# Patient Record
Sex: Female | Born: 1975 | Race: Black or African American | Hispanic: No | Marital: Married | Smoking: Never smoker
Health system: Southern US, Community
[De-identification: ages and names within clinical notes are randomized; demographics above are authoritative.]

## PROBLEM LIST (undated history)

## (undated) DIAGNOSIS — G43909 Migraine, unspecified, not intractable, without status migrainosus: Secondary | ICD-10-CM

## (undated) DIAGNOSIS — K589 Irritable bowel syndrome without diarrhea: Secondary | ICD-10-CM

## (undated) DIAGNOSIS — F419 Anxiety disorder, unspecified: Secondary | ICD-10-CM

## (undated) HISTORY — PX: TONSILLECTOMY: SUR1361

## (undated) HISTORY — PX: APPENDECTOMY: SHX54

## (undated) HISTORY — PX: CHOLECYSTECTOMY: SHX55

## (undated) HISTORY — PX: ANKLE SURGERY: SHX546

## (undated) HISTORY — PX: OOPHORECTOMY: SHX86

## (undated) HISTORY — PX: ABDOMINAL HYSTERECTOMY: SHX81

---

## 2017-02-09 ENCOUNTER — Encounter: Payer: Self-pay | Admitting: *Deleted

## 2017-02-09 ENCOUNTER — Ambulatory Visit
Admission: EM | Admit: 2017-02-09 | Discharge: 2017-02-09 | Disposition: A | Discharge status: Home / Self Care | Payer: BC Managed Care – PPO | Attending: Family Medicine | Admitting: Family Medicine

## 2017-02-09 ENCOUNTER — Ambulatory Visit (INDEPENDENT_AMBULATORY_CARE_PROVIDER_SITE_OTHER): Payer: BC Managed Care – PPO

## 2017-02-09 DIAGNOSIS — M940 Chondrocostal junction syndrome [Tietze]: Secondary | ICD-10-CM | POA: Diagnosis not present

## 2017-02-09 DIAGNOSIS — Z79899 Other long term (current) drug therapy: Secondary | ICD-10-CM | POA: Diagnosis not present

## 2017-02-09 DIAGNOSIS — B349 Viral infection, unspecified: Secondary | ICD-10-CM

## 2017-02-09 DIAGNOSIS — M791 Myalgia, unspecified site: Secondary | ICD-10-CM | POA: Diagnosis not present

## 2017-02-09 DIAGNOSIS — R590 Localized enlarged lymph nodes: Secondary | ICD-10-CM | POA: Diagnosis present

## 2017-02-09 DIAGNOSIS — Z9049 Acquired absence of other specified parts of digestive tract: Secondary | ICD-10-CM | POA: Diagnosis not present

## 2017-02-09 DIAGNOSIS — R0789 Other chest pain: Secondary | ICD-10-CM | POA: Diagnosis not present

## 2017-02-09 DIAGNOSIS — R0981 Nasal congestion: Secondary | ICD-10-CM | POA: Diagnosis present

## 2017-02-09 DIAGNOSIS — R6883 Chills (without fever): Secondary | ICD-10-CM

## 2017-02-09 HISTORY — DX: Migraine, unspecified, not intractable, without status migrainosus: G43.909

## 2017-02-09 MED ORDER — OSELTAMIVIR PHOSPHATE 75 MG PO CAPS: 75.0000 mg | ORAL_CAPSULE | Freq: Two times a day (BID) | ORAL | 0 refills | Status: DC

## 2017-02-09 NOTE — ED Triage Notes (Signed)
Runny nose, sneezing, swollen neck glands x1 week.

## 2017-02-09 NOTE — Discharge Instructions (Signed)
Take medication as prescribed. Rest. Drink plenty of fluids.  ° °Follow up with your primary care physician this week as needed. Return to Urgent care for new or worsening concerns.  ° °

## 2017-02-09 NOTE — ED Provider Notes (Signed)
[facility] ____________________________________________  Time seen: Approximately 9:00 AM  I have reviewed the triage vital signs and the nursing notes.   HISTORY  Chief Complaint Nasal Congestion and Lymphadenopathy   HPI Mindy Logan is a 42 y.o. female presenting with spouse at bedside for evaluation of 3 days of some runny nose, chills, body aches, with onset of right-sided gland discomfort and right-sided chest discomfort yesterday.  Patient denies sore throat or difficulty with swallowing.  States right neck gland felt like it was swollen.  States that she was having right chest discomfort that was present with deep breaths, palpation and movement.  States when sitting completely still and breathing normally, no chest pain.  Denies shortness of breath.  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  Reports continues to eat and drink well.  States has been taking some over-the-counter Aleve which helps as well as was taken elderberry.  Denies other aggravating or alleviating factors. Denies fall or trauma.  Denies hemoptysis, extremity swelling, syncope, near syncope, vision changes, paresthesias, pain radiation, recent immobilization, history of PE or DVT, history of cancer.  Reports otherwise feels well. Denies recent sickness. Denies recent antibiotic use. Not a smoker.  Darnelle MaffucciYang, Helen, MD: PCP No LMP recorded. Patient has had a hysterectomy.   Past Medical History:  Diagnosis Date  . Migraine     There are no active problems to display for this patient.   Past Surgical History:  Procedure Laterality Date  . ABDOMINAL HYSTERECTOMY    . ANKLE SURGERY Left   . APPENDECTOMY    . CHOLECYSTECTOMY    . OOPHORECTOMY    . TONSILLECTOMY       No current facility-administered medications for this encounter.   Current Outpatient Medications:  .  Diclofenac Potassium (CAMBIA PO), Take 50 mg by mouth., Disp: , Rfl:   .  oseltamivir (TAMIFLU) 75 MG capsule, Take 1 capsule (75 mg total) by mouth every 12 (twelve) hours., Disp: 10 capsule, Rfl: 0  Allergies Tetracyclines & related; Iodine; Clindamycin/lincomycin; and Tape  Family History  Problem Relation Age of Onset  . CAD Mother   . CAD Father   . Hyperlipidemia Father   Denies family history of clotting disorders, PE or DVT.  Social History Social History   Tobacco Use  . Smoking status: Never Smoker  . Smokeless tobacco: Never Used  Substance Use Topics  . Alcohol use: Yes  . Drug use: No    Review of Systems Constitutional: No fever/chills ENT: No sore throat. As above.  Cardiovascular: Positive chest pain. Respiratory: Denies shortness of breath. Gastrointestinal: No abdominal pain.  No nausea, no vomiting.  No diarrhea.  Genitourinary: Negative for dysuria. Musculoskeletal: Negative for back pain. Skin: Negative for rash. Neurological: Negative for focal weakness or numbness.  ____________________________________________   PHYSICAL EXAM:  VITAL SIGNS: ED Triage Vitals  Enc Vitals Group     BP 02/09/17 0840 134/90     Pulse Rate 02/09/17 0840 79     Resp 02/09/17 0840 16     Temp 02/09/17 0840 99 F (37.2 C)     Temp Source 02/09/17 0840 Oral     SpO2 02/09/17 0840 100 %     Weight 02/09/17 0842 205 lb (93 kg)     Height 02/09/17 0842 5\' 9"  (1.753 m)     Head Circumference --      Peak Flow --      Pain Score --  Pain Loc --      Pain Edu? --      Excl. in GC? --    Constitutional: Alert and oriented. Well appearing and in no acute distress. Eyes: Conjunctivae are normal. Head: Atraumatic. No sinus tenderness to palpation. No swelling. No erythema.  Ears: no erythema, normal TMs bilaterally. No mastoid tenderness bilaterally.  Nose:Nasal congestion   Mouth/Throat: Mucous membranes are moist. No pharyngeal erythema. No tonsillar swelling or exudate.  Neck: No stridor.  No cervical spine tenderness to  palpation. Hematological/Lymphatic/Immunilogical: No cervical lymphadenopathy.  No thyromegaly palpated. Cardiovascular: Normal rate, regular rhythm. Grossly normal heart sounds.  Good peripheral circulation. Respiratory: Normal respiratory effort.  No retractions. No wheezes, rales or rhonchi. Good air movement.  Gastrointestinal: Soft and nontender. No CVA tenderness. Musculoskeletal: Ambulatory with steady gait. No cervical, thoracic or lumbar tenderness to palpation.  Bilateral lower extremities nontender no edema noted.  Left chest nontender.  Right anterior mid chest mid clavicular line moderate tenderness to palpation, no rash, no erythema. Neurologic:  Normal speech and language. No gait instability. No focal neurological deficits noted.  Skin:  Skin appears warm, dry and intact. No rash noted. Psychiatric: Mood and affect are normal. Speech and behavior are normal.  PERC score for PE negative ___________________________________________   LABS (all labs ordered are listed, but only abnormal results are displayed)  Labs Reviewed - No data to display ____________________________________________  EKG  ED ECG REPORT I, Renford Dills, the attending provider, personally viewed and interpreted this ECG.   Date: 02/09/2017  EKG Time: 0921  Rate: 63  Rhythm: normal sinus rhythm  Axis: normal  Intervals:none  ST&T Change: No ST or T wave changes noted.   ____________________________________________  RADIOLOGY  Dg Chest 2 View  Result Date: 02/09/2017 CLINICAL DATA:  42 year old female with history of right-sided chest pain since yesterday evening. History of pneumonia. Prior history of pleurisy. EXAM: CHEST  2 VIEW COMPARISON:  No priors. FINDINGS: Lung volumes are normal. No consolidative airspace disease. No pleural effusions. No pneumothorax. No pulmonary nodule or mass noted. Pulmonary vasculature and the cardiomediastinal silhouette are within normal limits. IMPRESSION: No  radiographic evidence of acute cardiopulmonary disease. Electronically Signed   By: Trudie Reed M.D.   On: 02/09/2017 09:36   ____________________________________________   PROCEDURES Procedures    INITIAL IMPRESSION / ASSESSMENT AND PLAN / ED COURSE  Pertinent labs & imaging results that were available during my care of the patient were reviewed by me and considered in my medical decision making (see chart for details).  Well-appearing patient.  No acute distress.  Suspect viral illness, such as influenza-like.  suspect costochondritis.  EKG reviewed, unremarkable.  Chest x-ray results as above, negative.  Was able to see via care everywhere 12/28/2016 patient seen by cardiology and had a negative exercise stress echocardiogram.  Discussed use of Tamiflu with patient, patient requests Rx.  Will treat with Tamiflu.  Discussed over-the-counter NSAIDs as needed.  Encourage rest, fluids, supportive care.  Discussed strict follow-up and return parameters.Discussed indication, risks and benefits of medications with patient.  Discussed follow up with Primary care physician this week. Discussed follow up and return parameters including no resolution or any worsening concerns. Patient verbalized understanding and agreed to plan.   ____________________________________________   FINAL CLINICAL IMPRESSION(S) / ED DIAGNOSES  Final diagnoses:  Viral illness  Right-sided chest wall pain  Costochondritis     ED Discharge Orders        Ordered    oseltamivir (  TAMIFLU) 75 MG capsule  Every 12 hours     02/09/17 0944       Note: This dictation was prepared with Dragon dictation along with smaller phrase technology. Any transcriptional errors that result from this process are unintentional.         Renford Dills, NP 02/09/17 [phone]

## 2017-02-12 ENCOUNTER — Telehealth: Payer: Self-pay

## 2017-02-12 NOTE — Telephone Encounter (Signed)
Called to follow up with patient since visit here at [facility]. Patient instructed to call back with any questions or concerns. MAH  

## 2017-04-12 ENCOUNTER — Encounter: Payer: Self-pay | Admitting: *Deleted

## 2017-04-12 ENCOUNTER — Ambulatory Visit
Admission: EM | Admit: 2017-04-12 | Discharge: 2017-04-12 | Disposition: A | Discharge status: Home / Self Care | Payer: BC Managed Care – PPO | Attending: Family Medicine | Admitting: Family Medicine

## 2017-04-12 ENCOUNTER — Other Ambulatory Visit: Payer: Self-pay

## 2017-04-12 DIAGNOSIS — J111 Influenza due to unidentified influenza virus with other respiratory manifestations: Secondary | ICD-10-CM

## 2017-04-12 DIAGNOSIS — R69 Illness, unspecified: Secondary | ICD-10-CM | POA: Diagnosis not present

## 2017-04-12 DIAGNOSIS — J209 Acute bronchitis, unspecified: Secondary | ICD-10-CM | POA: Diagnosis not present

## 2017-04-12 DIAGNOSIS — R112 Nausea with vomiting, unspecified: Secondary | ICD-10-CM | POA: Diagnosis not present

## 2017-04-12 HISTORY — DX: Irritable bowel syndrome, unspecified: K58.9

## 2017-04-12 HISTORY — DX: Anxiety disorder, unspecified: F41.9

## 2017-04-12 MED ORDER — AZITHROMYCIN 250 MG PO TABS: 250.0000 mg | ORAL_TABLET | Freq: Every day | ORAL | 0 refills | Status: DC

## 2017-04-12 MED ORDER — OSELTAMIVIR PHOSPHATE 75 MG PO CAPS: 75.0000 mg | ORAL_CAPSULE | Freq: Two times a day (BID) | ORAL | 0 refills | Status: DC

## 2017-04-12 MED ORDER — ONDANSETRON HCL 4 MG PO TABS: 4.0000 mg | ORAL_TABLET | Freq: Four times a day (QID) | ORAL | 0 refills | Status: DC

## 2017-04-12 NOTE — ED Provider Notes (Signed)
[facility]    CSN: [id number] Arrival date & time: 04/12/17  1903     History   Chief Complaint Chief Complaint  Patient presents with  . Nausea  . Fever    HPI Mindy Logan is a 42 y.o. female.    Fever  Temp source:  Subjective Severity:  Mild Onset quality:  Gradual Duration:  1 day Timing:  Constant Progression:  Waxing and waning Chronicity:  New Relieved by:  Nothing Associated symptoms: chills, congestion, cough, ear pain, headaches, somnolence, sore throat and vomiting   Associated symptoms: no diarrhea, no dysuria, no myalgias, no nausea, no rash and no rhinorrhea   Risk factors: no contaminated food, no contaminated water, no hx of cancer, no immunosuppression, no occupational exposure, no recent sickness, no recent travel and no sick contacts   Emesis  Severity:  Mild Timing:  Intermittent Quality:  Coffee grounds Progression:  Partially resolved (just nausea) Associated symptoms: chills, cough, fever, headaches and sore throat   Associated symptoms: no abdominal pain, no arthralgias, no diarrhea and no myalgias   Cough  Cough characteristics:  Productive Sputum characteristics:  Yellow Onset quality:  Sudden Timing:  Intermittent Relieved by:  Cough suppressants Associated symptoms: chills, ear pain, fever, headaches and sore throat   Associated symptoms: no eye discharge, no myalgias, no rash, no rhinorrhea, no shortness of breath and no wheezing     Past Medical History:  Diagnosis Date  . Anxiety   . IBS (irritable bowel syndrome)   . Migraine     There are no active problems to display for this patient.   Past Surgical History:  Procedure Laterality Date  . ABDOMINAL HYSTERECTOMY    . ANKLE SURGERY Left   . APPENDECTOMY    . CHOLECYSTECTOMY    . OOPHORECTOMY    . TONSILLECTOMY      OB History   None      Home Medications    Prior to Admission medications   Medication Sig Start Date End Date Taking? Authorizing  Provider  ALPRAZolam Prudy Feeler(XANAX) 0.25 MG tablet Take by mouth. 10/25/15  Yes [provider]  azithromycin (ZITHROMAX) 250 MG tablet Take 1 tablet (250 mg total) by mouth daily. Take first 2 tablets together, then 1 every day until finished. 04/12/17   Duanne LimerickJones, Dakia Schifano C, MD  B Complex Vitamins (VITAMIN B COMPLEX PO) Take by mouth.    [provider]  CAMBIA 50 MG PACK  01/04/17   [provider]  Cholecalciferol (VITAMIN D3) 10000 units TABS Take by mouth.    [provider]  dicyclomine (BENTYL) 10 MG capsule Take 10 mg by mouth.    [provider]  niacin 50 MG tablet Take 50 mg by mouth.    [provider]  ondansetron (ZOFRAN) 4 MG tablet Take 1 tablet (4 mg total) by mouth every 6 (six) hours. 04/12/17   Duanne LimerickJones, Serene Kopf C, MD  oseltamivir (TAMIFLU) 75 MG capsule Take 1 capsule (75 mg total) by mouth every 12 (twelve) hours. 04/12/17   Duanne LimerickJones, Rockland Kotarski C, MD    Family History Family History  Problem Relation Age of Onset  . CAD Mother   . CAD Father   . Hyperlipidemia Father     Social History Social History   Tobacco Use  . Smoking status: Never Smoker  . Smokeless tobacco: Never Used  Substance Use Topics  . Alcohol use: Yes  . Drug use: No     Allergies   Banana;  Clindamycin; Kiwi extract; Povidone-iodine; Tetracycline; Tetracyclines & related; Iodine; Latex; Shellfish allergy; Wheat extract; Cephalexin; Clindamycin/lincomycin; Soap; and Tape   Review of Systems Review of Systems  Constitutional: Positive for chills and fever. Negative for fatigue and unexpected weight change.  HENT: Positive for congestion, ear pain and sore throat. Negative for ear discharge, rhinorrhea, sinus pressure and sneezing.   Eyes: Negative for photophobia, pain, discharge, redness and itching.  Respiratory: Positive for cough. Negative for shortness of breath, wheezing and stridor.   Gastrointestinal: Positive for vomiting. Negative for abdominal  pain, blood in stool, constipation, diarrhea and nausea.  Endocrine: Negative for cold intolerance, heat intolerance, polydipsia, polyphagia and polyuria.  Genitourinary: Negative for dysuria, flank pain, frequency, hematuria, menstrual problem, pelvic pain, urgency, vaginal bleeding and vaginal discharge.  Musculoskeletal: Negative for arthralgias, back pain and myalgias.  Skin: Negative for rash.  Allergic/Immunologic: Negative for environmental allergies and food allergies.  Neurological: Positive for headaches. Negative for dizziness, weakness, light-headedness and numbness.  Hematological: Negative for adenopathy. Does not bruise/bleed easily.  Psychiatric/Behavioral: Negative for dysphoric mood. The patient is not nervous/anxious.      Physical Exam Triage Vital Signs ED Triage Vitals  Enc Vitals Group     BP 04/12/17 1916 (!) 161/95     Pulse Rate 04/12/17 1916 (!) 101     Resp 04/12/17 1916 16     Temp 04/12/17 1916 (!) 100.4 F (38 C)     Temp Source 04/12/17 1916 Oral     SpO2 04/12/17 1916 99 %     Weight 04/12/17 1919 200 lb (90.7 kg)     Height 04/12/17 1919 5\' 5"  (1.651 m)     Head Circumference --      Peak Flow --      Pain Score 04/12/17 1918 0     Pain Loc --      Pain Edu? --      Excl. in GC? --    No data found.  Updated Vital Signs BP (!) 161/95 (BP Location: Left Arm)   Pulse (!) 101   Temp (!) 100.4 F (38 C) (Oral)   Resp 16   Ht 5\' 5"  (1.651 m)   Wt 200 lb (90.7 kg)   SpO2 99%   BMI 33.28 kg/m   Visual Acuity Right Eye Distance:   Left Eye Distance:   Bilateral Distance:    Right Eye Near:   Left Eye Near:    Bilateral Near:     Physical Exam  Constitutional: She is oriented to person, place, and time. She appears well-developed and well-nourished.  HENT:  Head: Normocephalic.  Right Ear: External ear normal.  Left Ear: External ear normal.  Mouth/Throat: Oropharynx is clear and moist.  Eyes: Pupils are equal, round, and reactive  to light. Conjunctivae and EOM are normal. Lids are everted and swept, no foreign bodies found. Left eye exhibits no hordeolum. No foreign body present in the left eye. Right conjunctiva is not injected. Left conjunctiva is not injected. No scleral icterus.  Neck: Normal range of motion. Neck supple. No JVD present. No tracheal deviation present. No thyromegaly present.  Cardiovascular: Normal rate, regular rhythm, normal heart sounds and intact distal pulses. Exam reveals no gallop and no friction rub.  No murmur heard. Pulmonary/Chest: Effort normal and breath sounds normal. No respiratory distress. She has no wheezes. She has no rales.  Abdominal: Soft. Bowel sounds are normal. She exhibits no mass. There is no hepatosplenomegaly. There is no tenderness. There  is no rebound, no guarding and no CVA tenderness.  Musculoskeletal: Normal range of motion. She exhibits no edema or tenderness.  Lymphadenopathy:    She has no cervical adenopathy.  Neurological: She is alert and oriented to person, place, and time. She has normal strength. She displays normal reflexes. No cranial nerve deficit.  Skin: Skin is warm. No rash noted.  Psychiatric: She has a normal mood and affect. Her mood appears not anxious. She does not exhibit a depressed mood.  Nursing note and vitals reviewed.    UC Treatments / Results  Labs (all labs ordered are listed, but only abnormal results are displayed) Labs Reviewed - No data to display  EKG None Radiology No results found.  Procedures Procedures (including critical care time)  Medications Ordered in UC Medications - No data to display   Initial Impression / Assessment and Plan / UC Course  I have reviewed the triage vital signs and the nursing notes.  Pertinent labs & imaging results that were available during my care of the patient were reviewed by me and considered in my medical decision making (see chart for details).       Final Clinical  Impressions(s) / UC Diagnoses   Final diagnoses:  Influenza-like illness  Acute bronchitis, unspecified organism  Non-intractable vomiting with nausea, unspecified vomiting type    ED Discharge Orders        Ordered    oseltamivir (TAMIFLU) 75 MG capsule  Every 12 hours     04/12/17 2000    azithromycin (ZITHROMAX) 250 MG tablet  Daily     04/12/17 2000    ondansetron (ZOFRAN) 4 MG tablet  Every 6 hours     04/12/17 2000       Controlled Substance Prescriptions  Controlled Substance Registry consulted?    Duanne Limerick, MD 04/12/17 2002

## 2017-04-12 NOTE — ED Triage Notes (Signed)
Patient started having symptoms of fever, nausea, and vomiting yesterday.

## 2017-04-13 ENCOUNTER — Telehealth: Payer: Self-pay

## 2017-04-13 NOTE — Telephone Encounter (Signed)
I called in new prescription for Bactrim DS to [facility] in [place]. I called and spoke to pt and her husband regarding new prescription. They verbalized understanding.

## 2017-09-29 ENCOUNTER — Ambulatory Visit
Admission: EM | Admit: 2017-09-29 | Discharge: 2017-09-29 | Disposition: A | Discharge status: Home / Self Care | Payer: BC Managed Care – PPO | Attending: Physician Assistant | Admitting: Physician Assistant

## 2017-09-29 ENCOUNTER — Other Ambulatory Visit: Payer: Self-pay

## 2017-09-29 ENCOUNTER — Encounter: Payer: Self-pay | Admitting: Gynecology

## 2017-09-29 DIAGNOSIS — N898 Other specified noninflammatory disorders of vagina: Secondary | ICD-10-CM

## 2017-09-29 DIAGNOSIS — N760 Acute vaginitis: Secondary | ICD-10-CM

## 2017-09-29 DIAGNOSIS — R21 Rash and other nonspecific skin eruption: Secondary | ICD-10-CM

## 2017-09-29 DIAGNOSIS — L299 Pruritus, unspecified: Secondary | ICD-10-CM | POA: Diagnosis not present

## 2017-09-29 LAB — WET PREP, GENITAL
CLUE CELLS WET PREP: NONE SEEN
SPERM: NONE SEEN
Trich, Wet Prep: NONE SEEN
YEAST WET PREP: NONE SEEN

## 2017-09-29 MED ORDER — FLUCONAZOLE 150 MG PO TABS: ORAL_TABLET | ORAL | 0 refills | Status: DC

## 2017-09-29 MED ORDER — MICONAZOLE NITRATE 2 % EX CREA: 1.0000 "application " | TOPICAL_CREAM | Freq: Two times a day (BID) | CUTANEOUS | 0 refills | Status: DC

## 2017-09-29 NOTE — ED Triage Notes (Signed)
Patient c/o vaginal rash x 2 weeks ago. Per patient rash spreading to other area of her body.

## 2017-09-29 NOTE — ED Provider Notes (Signed)
[facility]    CSN: [id number] Arrival date & time: 09/29/17  0858     History   Chief Complaint Chief Complaint  Patient presents with  . vaginal rash    HPI Mindy Logan is a 42 y.o. female. Patient presents today for 2 week history of red, itchy rash in the genital region. She states that the rash is spreading. She saw her PCP last week and had a UA which she says ruled out UTI. She has tried OTC Vagisil, Monistat and hydrocortisone cream without relief. Denies associated pain and vaginal discharge. She has no fevers. Denies urgency, frequency and painful urination. She has no other concerns today.  HPI  Past Medical History:  Diagnosis Date  . Anxiety   . IBS (irritable bowel syndrome)   . Migraine     There are no active problems to display for this patient.   Past Surgical History:  Procedure Laterality Date  . ABDOMINAL HYSTERECTOMY    . ANKLE SURGERY Left   . APPENDECTOMY    . CHOLECYSTECTOMY    . OOPHORECTOMY    . TONSILLECTOMY      OB History   None      Home Medications    Prior to Admission medications   Medication Sig Start Date End Date Taking? Authorizing Provider  ALPRAZolam Prudy Feeler) 0.25 MG tablet Take by mouth. 10/25/15  Yes [provider]  B Complex Vitamins (VITAMIN B COMPLEX PO) Take by mouth.   Yes [provider]  CAMBIA 50 MG PACK  01/04/17  Yes [provider]  Cholecalciferol (VITAMIN D3) 10000 units TABS Take by mouth.   Yes [provider]  dicyclomine (BENTYL) 10 MG capsule Take 10 mg by mouth.   Yes [provider]  niacin 50 MG tablet Take 50 mg by mouth.   Yes [provider]  azithromycin (ZITHROMAX) 250 MG tablet Take 1 tablet (250 mg total) by mouth daily. Take first 2 tablets together, then 1 every day until finished. 04/12/17   Duanne Limerick, MD  fluconazole (DIFLUCAN) 150 MG tablet Take 1 tab PO today and repeat every 72 hrs as needed 09/29/17   Eusebio Friendly  B, PA-C  miconazole (MICOTIN) 2 % cream Apply 1 application topically 2 (two) times daily for 7 days. 09/29/17 10/06/17  Eusebio Friendly B, PA-C  ondansetron (ZOFRAN) 4 MG tablet Take 1 tablet (4 mg total) by mouth every 6 (six) hours. 04/12/17   Duanne Limerick, MD  oseltamivir (TAMIFLU) 75 MG capsule Take 1 capsule (75 mg total) by mouth every 12 (twelve) hours. 04/12/17   Duanne Limerick, MD    Family History Family History  Problem Relation Age of Onset  . CAD Mother   . CAD Father   . Hyperlipidemia Father     Social History Social History   Tobacco Use  . Smoking status: Never Smoker  . Smokeless tobacco: Never Used  Substance Use Topics  . Alcohol use: Yes  . Drug use: No     Allergies   Banana; Clindamycin; Kiwi extract; Povidone-iodine; Tetracycline; Tetracyclines & related; Iodine; Latex; Shellfish allergy; Wheat extract; Cephalexin; Clindamycin/lincomycin; Soap; and Tape   Review of Systems Review of Systems  Constitutional: Negative for appetite change and fever.  Gastrointestinal: Negative for abdominal pain, nausea and vomiting.  Genitourinary: Negative for decreased urine volume, difficulty urinating, dysuria, flank pain, genital sores, hematuria, pelvic pain, urgency, vaginal discharge and vaginal pain.  Musculoskeletal: Negative for back pain and  myalgias.  Skin: Positive for rash. Negative for color change and wound.  Neurological: Negative for weakness.  Hematological: Negative for adenopathy.     Physical Exam Triage Vital Signs ED Triage Vitals  Enc Vitals Group     BP 09/29/17 0908 (!) 136/92     Pulse Rate 09/29/17 0908 72     Resp 09/29/17 0908 16     Temp 09/29/17 0908 98.4 F (36.9 C)     Temp Source 09/29/17 0908 Oral     SpO2 09/29/17 0908 99 %     Weight 09/29/17 0908 200 lb (90.7 kg)     Height 09/29/17 0908 5\' 5"  (1.651 m)     Head Circumference --      Peak Flow --      Pain Score 09/29/17 0907 3     Pain Loc --      Pain Edu? --       Excl. in GC? --    No data found.  Updated Vital Signs BP (!) 136/92 (BP Location: Left Arm)   Pulse 72   Temp 98.4 F (36.9 C) (Oral)   Resp 16   Ht 5\' 5"  (1.651 m)   Wt 200 lb (90.7 kg)   SpO2 99%   BMI 33.28 kg/m      Physical Exam  Constitutional: She is oriented to person, place, and time. She appears well-developed and well-nourished. No distress.  HENT:  Head: Normocephalic and atraumatic.  Eyes: Conjunctivae and EOM are normal. No scleral icterus.  Neck: Neck supple.  Cardiovascular: Normal rate, regular rhythm and normal heart sounds.  No murmur heard. Pulmonary/Chest: Effort normal and breath sounds normal. No respiratory distress. She has no wheezes. She has no rales.  Abdominal: Soft. There is no tenderness.  Genitourinary: Vaginal discharge found.  Genitourinary Comments: No obvious rate, very faint hyperpigmentation of mons pubis, labial folds and buttocks. Small amount of thick white discharge  Neurological: She is alert and oriented to person, place, and time.  Skin: Skin is warm and dry. She is not diaphoretic.  Psychiatric: She has a normal mood and affect. Her behavior is normal.  Nursing note and vitals reviewed.    UC Treatments / Results  Labs (all labs ordered are listed, but only abnormal results are displayed) Labs Reviewed  WET PREP, GENITAL - Abnormal; Notable for the following components:      Result Value   WBC, Wet Prep HPF POC FEW (*)    All other components within normal limits    EKG None  Radiology No results found.  Procedures Procedures (including critical care time)  Medications Ordered in UC Medications - No data to display  Initial Impression / Assessment and Plan / UC Course  I have reviewed the triage vital signs and the nursing notes.  Pertinent labs & imaging results that were available during my care of the patient were reviewed by me and considered in my medical decision making (see chart for details).   Wet  prep performed today to assess for yeast cells. Negative yeast, positive WBCs. Exam consistent with fungal vaginitis. Treating with fluconazole and miconazole. F/u with our office or PCP if worsening or no improvement in 1 week.   Final Clinical Impressions(s) / UC Diagnoses   Final diagnoses:  Vaginitis and vulvovaginitis  Rash and nonspecific skin eruption  Itching     Discharge Instructions     Your exam today is consistent with vaginitis most likely caused by a fungal infection.  Keep area clean and dry. Begin oral fluconazole (antifungal medication) and topical miconazole (antifungal medication). These should improve your symptoms over the next week. If symptoms are not improving over the next week or if they worsen f/u with PCP or our clinic for re-examination     ED Prescriptions    Medication Sig Dispense Auth. Provider   fluconazole (DIFLUCAN) 150 MG tablet Take 1 tab PO today and repeat every 72 hrs as needed 3 tablet Eusebio Friendly B, PA-C   miconazole (MICOTIN) 2 % cream Apply 1 application topically 2 (two) times daily for 7 days. 30 g Shirlee Latch, PA-C     Controlled Substance Prescriptions [place] Controlled Substance Registry consulted? Not Applicable   Gareth Morgan 09/29/17 2010

## 2017-09-29 NOTE — Discharge Instructions (Signed)
Your exam today is consistent with vaginitis most likely caused by a fungal infection. Keep area clean and dry. Begin oral fluconazole (antifungal medication) and topical miconazole (antifungal medication). These should improve your symptoms over the next week. If symptoms are not improving over the next week or if they worsen f/u with PCP or our clinic for re-examination

## 2017-10-05 ENCOUNTER — Ambulatory Visit
Admission: EM | Admit: 2017-10-05 | Discharge: 2017-10-05 | Disposition: A | Discharge status: Home / Self Care | Payer: BC Managed Care – PPO | Attending: Family Medicine | Admitting: Family Medicine

## 2017-10-05 ENCOUNTER — Encounter: Payer: Self-pay | Admitting: Gynecology

## 2017-10-05 DIAGNOSIS — R21 Rash and other nonspecific skin eruption: Secondary | ICD-10-CM | POA: Diagnosis not present

## 2017-10-05 MED ORDER — BETAMETHASONE DIPROPIONATE 0.05 % EX OINT: TOPICAL_OINTMENT | Freq: Two times a day (BID) | CUTANEOUS | 0 refills | Status: DC

## 2017-10-05 NOTE — ED Provider Notes (Signed)
[facility]    CSN: [id number] Arrival date & time: 10/05/17  [id number]  History   Chief Complaint Chief Complaint  Patient presents with  . Appointment  . Rash   HPI  42 year old female presents with rash.  Patient reports that she has had an ongoing rash for the past week.  Located at the anterior neck.  Itchy.  Red.  She has been using triamcinolone ointment without resolution.  She is also been taking Benadryl with improvement of the itching but no resolution of the rash.  She states that she has used a new laundry detergent.  No new jewelry or clothing.  No known inciting factor.  No known exacerbating factors.  No other reported symptoms.  No other complaints.   PMH, Surgical Hx, Family Hx, Social History reviewed and updated as below.  Past Medical History:  Diagnosis Date  . Anxiety   . IBS (irritable bowel syndrome)   . Migraine    Past Surgical History:  Procedure Laterality Date  . ABDOMINAL HYSTERECTOMY    . ANKLE SURGERY Left   . APPENDECTOMY    . CHOLECYSTECTOMY    . OOPHORECTOMY    . TONSILLECTOMY     OB History   None    Home Medications    Prior to Admission medications   Medication Sig Start Date End Date Taking? Authorizing Provider  ALPRAZolam Prudy Feeler(XANAX) 0.25 MG tablet Take by mouth. 10/25/15  Yes [provider]  B Complex Vitamins (VITAMIN B COMPLEX PO) Take by mouth.   Yes [provider]  CAMBIA 50 MG PACK  01/04/17  Yes [provider]  Cholecalciferol (VITAMIN D3) 10000 units TABS Take by mouth.   Yes [provider]  dicyclomine (BENTYL) 10 MG capsule Take 10 mg by mouth.   Yes [provider]  niacin 50 MG tablet Take 50 mg by mouth.   Yes [provider]  ondansetron (ZOFRAN) 4 MG tablet Take 1 tablet (4 mg total) by mouth every 6 (six) hours. 04/12/17  Yes Duanne LimerickJones, Deanna C, MD  betamethasone dipropionate (DIPROLENE) 0.05 % ointment Apply topically 2 (two) times daily. 10/05/17    Tommie Samsook, Tanasha Menees G, DO    Family History Family History  Problem Relation Age of Onset  . CAD Mother   . CAD Father   . Hyperlipidemia Father    Social History Social History   Tobacco Use  . Smoking status: Never Smoker  . Smokeless tobacco: Never Used  Substance Use Topics  . Alcohol use: Yes  . Drug use: No    Allergies   Banana; Clindamycin; Kiwi extract; Povidone-iodine; Tetracycline; Tetracyclines & related; Iodine; Latex; Shellfish allergy; Wheat extract; Cephalexin; Clindamycin/lincomycin; Soap; and Tape   Review of Systems Review of Systems  Constitutional: Negative.   Skin: Positive for rash.   Physical Exam Triage Vital Signs ED Triage Vitals  Enc Vitals Group     BP 10/05/17 0820 (!) 135/96     Pulse Rate 10/05/17 0820 82     Resp 10/05/17 0820 18     Temp 10/05/17 0820 98.9 F (37.2 C)     Temp Source 10/05/17 0820 Oral     SpO2 10/05/17 0820 100 %     Weight 10/05/17 0819 200 lb (90.7 kg)     Height --      Head Circumference --      Peak Flow --      Pain Score --      Pain Loc --  Pain Edu? --      Excl. in GC? --    Updated Vital Signs BP (!) 135/96 (BP Location: Left Arm)   Pulse 82   Temp 98.9 F (37.2 C) (Oral)   Resp 18   Wt 90.7 kg   SpO2 100%   BMI 33.28 kg/m   Visual Acuity Right Eye Distance:   Left Eye Distance:   Bilateral Distance:    Right Eye Near:   Left Eye Near:    Bilateral Near:     Physical Exam  Constitutional: She is oriented to person, place, and time. She appears well-developed. No distress.  Cardiovascular: Normal rate and regular rhythm.  Pulmonary/Chest: Effort normal and breath sounds normal. She has no wheezes. She has no rales.  Neurological: She is alert and oriented to person, place, and time.  Skin:  Anterior neck with erythematous, slightly raised rash.  Psychiatric: She has a normal mood and affect. Her behavior is normal.  Nursing note and vitals reviewed.  UC Treatments / Results   Labs (all labs ordered are listed, but only abnormal results are displayed) Labs Reviewed - No data to display  EKG None  Radiology No results found.  Procedures Procedures (including critical care time)  Medications Ordered in UC Medications - No data to display  Initial Impression / Assessment and Plan / UC Course  I have reviewed the triage vital signs and the nursing notes.  Pertinent labs & imaging results that were available during my care of the patient were reviewed by me and considered in my medical decision making (see chart for details).    42 year old female presents with rash.  Appears to be contact or allergic in nature.  Treating with betamethasone ointment.  Advised to see dermatology if she fails to improve or worsens.  Final Clinical Impressions(s) / UC Diagnoses   Final diagnoses:  Rash     Discharge Instructions     If persists, call [facility] - [phone]  Ointment as prescribed  Take care  Dr. Adriana Simas    ED Prescriptions    Medication Sig Dispense Auth. Provider   betamethasone dipropionate (DIPROLENE) 0.05 % ointment Apply topically 2 (two) times daily. 30 g Tommie Sams, DO     Controlled Substance Prescriptions [place] Controlled Substance Registry consulted? Not Applicable   Tommie Sams, [place] 10/05/17 [phone]

## 2017-10-05 NOTE — Discharge Instructions (Signed)
If persists, call [facility] - [phone]  Ointment as prescribed  Take care  Dr. Adriana Simasook

## 2017-10-05 NOTE — ED Triage Notes (Signed)
Patient c/o rash on neck x 1 week.

## 2020-01-30 ENCOUNTER — Other Ambulatory Visit: Payer: Self-pay

## 2020-01-30 ENCOUNTER — Ambulatory Visit (INDEPENDENT_AMBULATORY_CARE_PROVIDER_SITE_OTHER): Payer: BC Managed Care – PPO

## 2020-01-30 ENCOUNTER — Encounter (HOSPITAL_COMMUNITY): Payer: Self-pay | Admitting: Urgent Care

## 2020-01-30 ENCOUNTER — Ambulatory Visit (HOSPITAL_COMMUNITY)
Admission: EM | Admit: 2020-01-30 | Discharge: 2020-01-30 | Disposition: A | Discharge status: Home / Self Care | Payer: BC Managed Care – PPO | Attending: Internal Medicine | Admitting: Internal Medicine

## 2020-01-30 ENCOUNTER — Telehealth (HOSPITAL_COMMUNITY): Payer: Self-pay | Admitting: Emergency Medicine

## 2020-01-30 DIAGNOSIS — R0789 Other chest pain: Secondary | ICD-10-CM

## 2020-01-30 DIAGNOSIS — R059 Cough, unspecified: Secondary | ICD-10-CM | POA: Diagnosis not present

## 2020-01-30 MED ORDER — PREDNISONE 50 MG PO TABS: 50.0000 mg | ORAL_TABLET | Freq: Every day | ORAL | 0 refills | Status: DC

## 2020-01-30 NOTE — Discharge Instructions (Addendum)
See your Physician for recheck in 1 week  °

## 2020-01-30 NOTE — ED Triage Notes (Signed)
Pt reports pain to Lt side of chest . Pt reports the pain is worse when deep breathing or bending over. Pt report pain feels like the costochondritis she has had in the past.

## 2020-01-30 NOTE — ED Triage Notes (Signed)
PT reports taking tylenol and percocet for pain

## 2020-01-30 NOTE — ED Provider Notes (Signed)
[facility]    CSN: [id number] Arrival date & time: 01/30/20  1340      History   Chief Complaint Chief Complaint  Patient presents with  . Chest Pain    HPI Mindy Logan is a 45 y.o. female.   The history is provided by the patient. No language interpreter was used.  Chest Pain Pain location:  L chest Pain quality: aching   Pain radiates to:  Does not radiate Pain severity:  Moderate Onset quality:  Gradual Timing:  Constant Chronicity:  New Relieved by:  Nothing Worsened by:  Nothing Ineffective treatments:  None tried Associated symptoms: cough   Associated symptoms: no dysphagia, no fever and no nausea   Pt reports she has had costochondritis in the past and pain feels the same. Pt complains of pain with movement   Past Medical History:  Diagnosis Date  . Anxiety   . IBS (irritable bowel syndrome)   . Migraine     There are no problems to display for this patient.   Past Surgical History:  Procedure Laterality Date  . ABDOMINAL HYSTERECTOMY    . ANKLE SURGERY Left   . APPENDECTOMY    . CHOLECYSTECTOMY    . OOPHORECTOMY    . TONSILLECTOMY      OB History   No obstetric history on file.      Home Medications    Prior to Admission medications   Medication Sig Start Date End Date Taking? Authorizing Provider  ALPRAZolam Prudy Feeler) 0.25 MG tablet Take by mouth. 10/25/15   [provider]  B Complex Vitamins (VITAMIN B COMPLEX PO) Take by mouth.    [provider]  betamethasone dipropionate (DIPROLENE) 0.05 % ointment Apply topically 2 (two) times daily. 10/05/17   Tommie Sams, DO  CAMBIA 50 MG PACK  01/04/17   [provider]  Cholecalciferol (VITAMIN D3) 10000 units TABS Take by mouth.    [provider]  dicyclomine (BENTYL) 10 MG capsule Take 10 mg by mouth.    [provider]  niacin 50 MG tablet Take 50 mg by mouth.    [provider]  ondansetron (ZOFRAN) 4 MG tablet Take 1 tablet  (4 mg total) by mouth every 6 (six) hours. 04/12/17   Duanne Limerick, MD  predniSONE (DELTASONE) 50 MG tablet Take 1 tablet (50 mg total) by mouth daily. 01/30/20   Elson Areas, PA-C    Family History Family History  Problem Relation Age of Onset  . CAD Mother   . CAD Father   . Hyperlipidemia Father     Social History Social History   Tobacco Use  . Smoking status: Never Smoker  . Smokeless tobacco: Never Used  Vaping Use  . Vaping Use: Never used  Substance Use Topics  . Alcohol use: Yes  . Drug use: No     Allergies   Banana, Clindamycin, Kiwi extract, Other, Povidone-iodine, Tetracycline, Tetracyclines & related, Iodine, Latex, Shellfish allergy, Wheat extract, Cephalexin, Clindamycin/lincomycin, Soap, and Tape   Review of Systems Review of Systems  Constitutional: Negative for fever.  HENT: Negative for trouble swallowing.   Respiratory: Positive for cough.   Cardiovascular: Positive for chest pain.  Gastrointestinal: Negative for nausea.  All other systems reviewed and are negative.    Physical Exam Triage Vital Signs ED Triage Vitals  Enc Vitals Group     BP 01/30/20 1414 138/84     Pulse Rate 01/30/20 1414 84  Resp 01/30/20 1414 18     Temp 01/30/20 1414 98.4 F (36.9 C)     Temp Source 01/30/20 1414 Oral     SpO2 01/30/20 1414 100 %     Weight 01/30/20 1416 210 lb (95.3 kg)     Height 01/30/20 1416 5' 5.5" (1.664 m)     Head Circumference --      Peak Flow --      Pain Score 01/30/20 1415 5     Pain Loc --      Pain Edu? --      Excl. in GC? --    No data found.  Updated Vital Signs BP 138/84 (BP Location: Right Arm)   Pulse 84   Temp 98.4 F (36.9 C) (Oral)   Resp 18   Ht 5' 5.5" (1.664 m)   Wt 95.3 kg   SpO2 100%   BMI 34.41 kg/m   Visual Acuity Right Eye Distance:   Left Eye Distance:   Bilateral Distance:    Right Eye Near:   Left Eye Near:    Bilateral Near:     Physical Exam Vitals and nursing note reviewed.   Constitutional:      Appearance: She is well-developed and well-nourished.  HENT:     Head: Normocephalic.  Eyes:     Extraocular Movements: EOM normal.  Cardiovascular:     Rate and Rhythm: Normal rate and regular rhythm.     Heart sounds: Normal heart sounds.  Pulmonary:     Effort: Pulmonary effort is normal.     Breath sounds: Normal breath sounds.  Abdominal:     General: There is no distension.  Musculoskeletal:        General: Normal range of motion.     Cervical back: Normal range of motion.  Skin:    General: Skin is warm.  Neurological:     General: No focal deficit present.     Mental Status: She is alert and oriented to person, place, and time.  Psychiatric:        Mood and Affect: Mood and affect normal.      UC Treatments / Results  Labs (all labs ordered are listed, but only abnormal results are displayed) Labs Reviewed - No data to display  EKG   Radiology DG Chest 2 View  Result Date: 01/30/2020 CLINICAL DATA:  Cough and left-sided chest pain EXAM: CHEST - 2 VIEW COMPARISON:  02/09/2017 FINDINGS: The heart size and mediastinal contours are within normal limits. Both lungs are clear. The visualized skeletal structures are unremarkable. IMPRESSION: No active cardiopulmonary disease. Electronically Signed   By: Paulina Fusi M.D.   On: 01/30/2020 15:04    Procedures Procedures (including critical care time)  Medications Ordered in UC Medications - No data to display  Initial Impression / Assessment and Plan / UC Course  I have reviewed the triage vital signs and the nursing notes.  Pertinent labs & imaging results that were available during my care of the patient were reviewed by me and considered in my medical decision making (see chart for details).     MDM:  Chest xray is normal,  EKG no acute changes.   Final Clinical Impressions(s) / UC Diagnoses   Final diagnoses:  Chest wall pain     Discharge Instructions     See your Physician  for recheck in 1 week    ED Prescriptions    Medication Sig Dispense Auth. Provider   predniSONE (DELTASONE) 50  MG tablet Take 1 tablet (50 mg total) by mouth daily. 6 tablet Elson Areas, [place]     PDMP not reviewed this encounter.  An After Visit Summary was printed and given to the patient.   Elson Areas, [place] 01/30/20 1711

## 2020-10-11 ENCOUNTER — Other Ambulatory Visit: Payer: Self-pay

## 2020-10-11 ENCOUNTER — Ambulatory Visit
Admission: RE | Admit: 2020-10-11 | Discharge: 2020-10-11 | Disposition: A | Discharge status: Home / Self Care | Payer: BC Managed Care – PPO | Source: Ambulatory Visit | Attending: Emergency Medicine | Admitting: Emergency Medicine

## 2020-10-11 VITALS — BP 136/85 | HR 94 | Temp 99.1°F | Resp 16

## 2020-10-11 DIAGNOSIS — L03114 Cellulitis of left upper limb: Secondary | ICD-10-CM | POA: Diagnosis not present

## 2020-10-11 DIAGNOSIS — L03115 Cellulitis of right lower limb: Secondary | ICD-10-CM

## 2020-10-11 DIAGNOSIS — W57XXXA Bitten or stung by nonvenomous insect and other nonvenomous arthropods, initial encounter: Secondary | ICD-10-CM

## 2020-10-11 MED ORDER — SULFAMETHOXAZOLE-TRIMETHOPRIM 800-160 MG PO TABS: 1.0000 | ORAL_TABLET | Freq: Two times a day (BID) | ORAL | 0 refills | Status: AC

## 2020-10-11 NOTE — ED Triage Notes (Signed)
Pt here with insect bite that was not a mosquito to right inside ankle and left arm since Sunday. Goes through periods of being inflamed and painful and has a small blister on the ankle bite. Triamcinolone cream she had left over seems to work best.

## 2020-10-11 NOTE — ED Provider Notes (Signed)
Mindy [facility]    CSN: [id number] Arrival date & time: 10/11/20  1234      History   Chief Complaint Chief Complaint  Patient presents with   Insect Bite    HPI Mindy Logan is a 45 y.o. female.  Patient presents with multiple insect bites on her right ankle and left upper arm x2 days.  The areas have gotten red and swollen.  She denies fever, chills, drainage from the wounds, or other symptoms.  Treatment at home with triamcinolone cream which she had leftover from a previous illness.  Her medical history includes IBS, migraine headaches, anxiety.  The history is provided by the patient and medical records.   Past Medical History:  Diagnosis Date   Anxiety    IBS (irritable bowel syndrome)    Migraine     There are no problems to display for this patient.   Past Surgical History:  Procedure Laterality Date   ABDOMINAL HYSTERECTOMY     ANKLE SURGERY Left    APPENDECTOMY     CHOLECYSTECTOMY     OOPHORECTOMY     TONSILLECTOMY      OB History   No obstetric history on file.      Home Medications    Prior to Admission medications   Medication Sig Start Date End Date Taking? Authorizing Provider  sulfamethoxazole-trimethoprim (BACTRIM DS) 800-160 MG tablet Take 1 tablet by mouth 2 (two) times daily for 7 days. 10/11/20 10/18/20 Yes Mickie Bail, NP  ALPRAZolam Prudy Feeler) 0.25 MG tablet Take by mouth. 10/25/15   [provider]  B Complex Vitamins (VITAMIN B COMPLEX PO) Take by mouth.    [provider]  betamethasone dipropionate (DIPROLENE) 0.05 % ointment Apply topically 2 (two) times daily. 10/05/17   Tommie Sams, DO  CAMBIA 50 MG PACK  01/04/17   [provider]  Cholecalciferol (VITAMIN D3) 10000 units TABS Take by mouth.    [provider]  dicyclomine (BENTYL) 10 MG capsule Take 10 mg by mouth.    [provider]  niacin 50 MG tablet Take 50 mg by mouth.    [provider]  ondansetron (ZOFRAN) 4 MG  tablet Take 1 tablet (4 mg total) by mouth every 6 (six) hours. 04/12/17   Duanne Limerick, MD  predniSONE (DELTASONE) 50 MG tablet Take 1 tablet (50 mg total) by mouth daily. 01/30/20   Elson Areas, PA-C    Family History Family History  Problem Relation Age of Onset   CAD Mother    CAD Father    Hyperlipidemia Father     Social History Social History   Tobacco Use   Smoking status: Never   Smokeless tobacco: Never  Vaping Use   Vaping Use: Never used  Substance Use Topics   Alcohol use: Yes   Drug use: No     Allergies   Banana, Clindamycin, Kiwi extract, Other, Povidone-iodine, Tetracycline, Tetracyclines & related, Iodine, Latex, Shellfish allergy, Wheat extract, Cephalexin, Clindamycin/lincomycin, Soap, and Tape   Review of Systems Review of Systems  Constitutional:  Negative for chills and fever.  Respiratory:  Negative for cough and shortness of breath.   Cardiovascular:  Negative for chest pain and palpitations.  Musculoskeletal:  Negative for arthralgias and joint swelling.  Skin:  Positive for color change and wound.  All other systems reviewed and are negative.   Physical Exam Triage Vital Signs ED Triage Vitals [10/11/20 1249]  Enc Vitals Group  BP 136/85     Pulse Rate 94     Resp 16     Temp 99.1 F (37.3 C)     Temp src      SpO2 98 %     Weight      Height      Head Circumference      Peak Flow      Pain Score 4     Pain Loc      Pain Edu?      Excl. in GC?    No data found.  Updated Vital Signs BP 136/85   Pulse 94   Temp 99.1 F (37.3 C)   Resp 16   SpO2 98%   Visual Acuity Right Eye Distance:   Left Eye Distance:   Bilateral Distance:    Right Eye Near:   Left Eye Near:    Bilateral Near:     Physical Exam Vitals and nursing note reviewed.  Constitutional:      General: She is not in acute distress.    Appearance: She is well-developed. She is not ill-appearing.  HENT:     Head: Normocephalic and atraumatic.      Mouth/Throat:     Mouth: Mucous membranes are moist.  Eyes:     Conjunctiva/sclera: Conjunctivae normal.  Cardiovascular:     Rate and Rhythm: Normal rate and regular rhythm.     Heart sounds: Normal heart sounds.  Pulmonary:     Effort: Pulmonary effort is normal. No respiratory distress.     Breath sounds: Normal breath sounds.  Abdominal:     Palpations: Abdomen is soft.     Tenderness: There is no abdominal tenderness.  Musculoskeletal:        General: No swelling. Normal range of motion.     Cervical back: Neck supple.  Skin:    General: Skin is warm and dry.     Findings: Erythema and lesion present.     Comments: 2 small pustules with surrounding erythema on right ankle; erythema extends to lower leg: No streaks or drainage.  3 papules on left upper arm with surrounding erythema; no drainage or streaks.   Neurological:     General: No focal deficit present.     Mental Status: She is alert and oriented to person, place, and time.     Gait: Gait normal.  Psychiatric:        Mood and Affect: Mood normal.        Behavior: Behavior normal.     UC Treatments / Results  Labs (all labs ordered are listed, but only abnormal results are displayed) Labs Reviewed - No data to display  EKG   Radiology No results found.  Procedures Procedures (including critical care time)  Medications Ordered in UC Medications - No data to display  Initial Impression / Assessment and Plan / UC Course  I have reviewed the triage vital signs and the nursing notes.  Pertinent labs & imaging results that were available during my care of the patient were reviewed by me and considered in my medical decision making (see chart for details).  Cellulitis of right lower leg and left upper arm due to insect bites.  Patient has allergies to multiple medications.  Treating with Bactrim DS.  Wound care instructions and signs of worsening infection discussed with patient.  Instructed her to  follow-up with her PCP or return here if she notes signs of worsening infection.  She agrees to plan of  care.   Final Clinical Impressions(s) / UC Diagnoses   Final diagnoses:  Cellulitis of right lower leg  Cellulitis of left upper arm  Insect bite, unspecified site, initial encounter     Discharge Instructions      Take the Bactrim as directed.  Keep the areas clean and dry.  Wash gently twice a day with soap and water.  Apply an antibiotic cream twice a day.    Follow-up with your primary care provider or return here if you see signs of worsening infection, such as increased redness, pus-like drainage, fever, chills, or other concerning symptoms.        ED Prescriptions     Medication Sig Dispense Auth. Provider   sulfamethoxazole-trimethoprim (BACTRIM DS) 800-160 MG tablet Take 1 tablet by mouth 2 (two) times daily for 7 days. 14 tablet Mickie Bail, NP      PDMP not reviewed this encounter.   Mickie Bail, NP 10/11/20 1321

## 2020-10-11 NOTE — Discharge Instructions (Addendum)
Take the Bactrim as directed.  Keep the areas clean and dry.  Wash gently twice a day with soap and water.  Apply an antibiotic cream twice a day.    Follow-up with your primary care provider or return here if you see signs of worsening infection, such as increased redness, pus-like drainage, fever, chills, or other concerning symptoms.

## 2020-11-15 ENCOUNTER — Ambulatory Visit: Payer: Self-pay

## 2021-03-27 ENCOUNTER — Other Ambulatory Visit: Payer: Self-pay

## 2021-03-27 ENCOUNTER — Ambulatory Visit (INDEPENDENT_AMBULATORY_CARE_PROVIDER_SITE_OTHER)
Admit: 2021-03-27 | Discharge: 2021-03-27 | Disposition: A | Discharge status: Home / Self Care | Payer: BC Managed Care – PPO | Attending: Internal Medicine | Admitting: Internal Medicine

## 2021-03-27 ENCOUNTER — Ambulatory Visit
Admission: RE | Admit: 2021-03-27 | Discharge: 2021-03-27 | Disposition: A | Discharge status: Home / Self Care | Payer: BC Managed Care – PPO | Source: Ambulatory Visit

## 2021-03-27 VITALS — BP 146/86 | HR 67 | Temp 99.0°F | Resp 17

## 2021-03-27 DIAGNOSIS — R051 Acute cough: Secondary | ICD-10-CM

## 2021-03-27 DIAGNOSIS — J069 Acute upper respiratory infection, unspecified: Secondary | ICD-10-CM | POA: Diagnosis not present

## 2021-03-27 DIAGNOSIS — J329 Chronic sinusitis, unspecified: Secondary | ICD-10-CM

## 2021-03-27 DIAGNOSIS — B9689 Other specified bacterial agents as the cause of diseases classified elsewhere: Secondary | ICD-10-CM

## 2021-03-27 DIAGNOSIS — J310 Chronic rhinitis: Secondary | ICD-10-CM

## 2021-03-27 DIAGNOSIS — R059 Cough, unspecified: Secondary | ICD-10-CM

## 2021-03-27 MED ORDER — IPRATROPIUM BROMIDE 0.06 % NA SOLN: 2.0000 | Freq: Four times a day (QID) | NASAL | 0 refills | Status: AC

## 2021-03-27 MED ORDER — FLUCONAZOLE 150 MG PO TABS: ORAL_TABLET | ORAL | 0 refills | Status: AC

## 2021-03-27 MED ORDER — CEFDINIR 300 MG PO CAPS: 300.0000 mg | ORAL_CAPSULE | Freq: Two times a day (BID) | ORAL | 0 refills | Status: DC

## 2021-03-27 MED ORDER — MOMETASONE FUROATE 50 MCG/ACT NA SUSP: 2.0000 | Freq: Every day | NASAL | 2 refills | Status: AC

## 2021-03-27 MED ORDER — AMOXICILLIN-POT CLAVULANATE 875-125 MG PO TABS: 1.0000 | ORAL_TABLET | Freq: Two times a day (BID) | ORAL | 0 refills | Status: AC

## 2021-03-27 NOTE — ED Triage Notes (Signed)
Pt having cough, congestion, post nasal drip, fatigue, for over week. Reports home covid test negative but concerned for PNA as had hx of it. Reports sometimes when swallows feels fluid in ear. Been taking nasal spray without steroids that has been helping.  ?

## 2021-03-27 NOTE — Discharge Instructions (Addendum)
Your chest x-ray today is unremarkable for any signs of pulmonary disease.  Because you are showing signs of upper respiratory infection, I do recommend that you begin a Augmentin, 1 capsule twice daily for the next 10 days. ? ?I have renewed your prescription for ipratropium nasal spray.  Also provided you with a prescription for mometasone which appears to be covered by your insurance.  I provided you with 2 refills of each. ? ?If you tolerate ibuprofen, I always recommend 400 mg 3 times daily for respiratory infections because ibuprofen is anti-inflammatory, columns respiratory inflammation and can help move things along with your recovery. ? ?Finally, because we know that antibiotics cause vaginal yeast infections in women, I provided you with a prescription for 2 tablets of Diflucan.  Please take them as prescribed if needed. ? ?Thank you for visiting urgent care today.  I appreciate the opportunity to participate in your care. ?

## 2021-03-27 NOTE — ED Provider Notes (Addendum)
[facility]    CSN: [id number] Arrival date & time: 03/27/21  0803    HISTORY   Chief Complaint  Patient presents with   appt 830   Cough   Nasal Congestion   HPI Mindy Logan is a 46 y.o. female. Pt having cough, congestion, post nasal drip, fatigue, for about 2 weeks.  Reports home covid test negative but concerned for PNA and possible pleural effusion as she has had this in the past.  Patient states she had walking pneumonia for 4 months and eventually ended up on Levaquin after 2 other courses of antibiotics that were ineffective.  Patient also reports sometimes when swallows feels fluid in her right ear along with some pressure, denies hearing loss. Been taking ipratropium nasal spray that has been helping.  Patient states she also regularly uses a Nettie pot to wash out her sinuses, states she is unsure what her sinus drainage looks like this.  Patient denies nausea, vomiting, diarrhea.  Patient states initially some body aches but this is resolved.  The history is provided by the patient.  Past Medical History:  Diagnosis Date   Anxiety    IBS (irritable bowel syndrome)    Migraine    There are no problems to display for this patient.  Past Surgical History:  Procedure Laterality Date   ABDOMINAL HYSTERECTOMY     ANKLE SURGERY Left    APPENDECTOMY     CHOLECYSTECTOMY     OOPHORECTOMY     TONSILLECTOMY     OB History   No obstetric history on file.    Home Medications    Prior to Admission medications   Medication Sig Start Date End Date Taking? Authorizing Provider  ALPRAZolam Prudy Feeler) 0.25 MG tablet Take by mouth. 10/25/15   [provider]  B Complex Vitamins (VITAMIN B COMPLEX PO) Take by mouth.    [provider]  betamethasone dipropionate (DIPROLENE) 0.05 % ointment Apply topically 2 (two) times daily. 10/05/17   Tommie Sams, DO  CAM[place] 50 MG PACK  01/04/17   [provider]  Cholecalciferol (VITAMIN D3) 10000 units  TABS Take by mouth.    [provider]  dicyclomine (BENTYL) 10 MG capsule Take 10 mg by mouth.    [provider]  niacin 50 MG tablet Take 50 mg by mouth.    [provider]  ondansetron (ZOFRAN) 4 MG tablet Take 1 tablet (4 mg total) by mouth every 6 (six) hours. 04/12/17   Duanne Limerick, MD  predniSONE (DELTASONE) 50 MG tablet Take 1 tablet (50 mg total) by mouth daily. 01/30/20   Elson Areas, PA-C   Family History Family History  Problem Relation Age of Onset   CAD Mother    CAD Father    Hyperlipidemia Father    Social History Social History   Tobacco Use   Smoking status: Never   Smokeless tobacco: Never  Vaping Use   Vaping Use: Never used  Substance Use Topics   Alcohol use: Yes   Drug use: No   Allergies   Banana, Clindamycin, Kiwi extract, Other, Povidone-iodine, Tetracycline, Tetracyclines & related, Iodine, Latex, Shellfish allergy, Wheat extract, Cephalexin, Clindamycin/lincomycin, Soap, and Tape  Review of Systems Review of Systems Pertinent findings noted in history of present illness.   Physical Exam Triage Vital Signs ED Triage Vitals  Enc Vitals Group     BP 11/18/20 0827 (!) 147/82     Pulse Rate 11/18/20 0827 72  Resp 11/18/20 0827 18     Temp 11/18/20 0827 98.3 F (36.8 C)     Temp Source 11/18/20 0827 Oral     SpO2 11/18/20 0827 98 %     Weight --      Height --      Head Circumference --      Peak Flow --      Pain Score 11/18/20 0826 5     Pain Loc --      Pain Edu? --      Excl. in GC? --   No data found.  Updated Vital Signs BP (!) 146/86 (BP Location: Left Arm)    Pulse 67    Temp 99 F (37.2 C) (Oral)    Resp 17    SpO2 98%   Physical Exam Vitals and nursing note reviewed.  Constitutional:      General: She is not in acute distress.    Appearance: Normal appearance. She is not ill-appearing.  HENT:     Head: Normocephalic and atraumatic.     Salivary Glands: Right salivary gland is not  diffusely enlarged or tender. Left salivary gland is not diffusely enlarged or tender.     Right Ear: Hearing and external ear normal. No drainage. A middle ear effusion is present. There is no impacted cerumen. Tympanic membrane is bulging. Tympanic membrane is not injected or erythematous.     Left Ear: Hearing, ear canal and external ear normal. No drainage. A middle ear effusion is present. There is no impacted cerumen. Tympanic membrane is bulging. Tympanic membrane is not injected or erythematous.     Ears:     Comments: Right EAC with erythema, left EAC is normal.  Both TMs bulging with clear fluid.    Nose: Mucosal edema, congestion and rhinorrhea present. No nasal deformity, septal deviation, signs of injury or nasal tenderness.     Right Nostril: Occlusion present. No foreign body, epistaxis or septal hematoma.     Left Nostril: Occlusion present. No foreign body, epistaxis or septal hematoma.     Right Turbinates: Enlarged, swollen and pale.     Left Turbinates: Enlarged, swollen and pale.     Right Sinus: No maxillary sinus tenderness or frontal sinus tenderness.     Left Sinus: No maxillary sinus tenderness or frontal sinus tenderness.     Comments: Right mucosal edema greater than left.    Mouth/Throat:     Lips: Pink. No lesions.     Mouth: Mucous membranes are moist. No oral lesions.     Pharynx: Oropharynx is clear. Uvula midline. No pharyngeal swelling, oropharyngeal exudate, posterior oropharyngeal erythema or uvula swelling.     Tonsils: No tonsillar exudate. 0 on the right. 0 on the left.     Comments: Postnasal drip Eyes:     General: Lids are normal.        Right eye: No discharge.        Left eye: No discharge.     Extraocular Movements: Extraocular movements intact.     Conjunctiva/sclera: Conjunctivae normal.     Right eye: Right conjunctiva is not injected.     Left eye: Left conjunctiva is not injected.  Neck:     Trachea: Trachea and phonation normal.   Cardiovascular:     Rate and Rhythm: Normal rate and regular rhythm.     Pulses: Normal pulses.     Heart sounds: Normal heart sounds. No murmur heard.   No friction rub. No gallop.  Pulmonary:     Effort: Pulmonary effort is normal. No accessory muscle usage, prolonged expiration or respiratory distress.     Breath sounds: Normal breath sounds. No stridor, decreased air movement or transmitted upper airway sounds. No decreased breath sounds, wheezing, rhonchi or rales.  Chest:     Chest wall: No tenderness.  Musculoskeletal:        General: Normal range of motion.     Cervical back: Normal range of motion and neck supple. Normal range of motion.  Lymphadenopathy:     Cervical: No cervical adenopathy.  Skin:    General: Skin is warm and dry.     Findings: No erythema or rash.  Neurological:     General: No focal deficit present.     Mental Status: She is alert and oriented to person, place, and time.  Psychiatric:        Mood and Affect: Mood normal.        Behavior: Behavior normal.    Visual Acuity Right Eye Distance:   Left Eye Distance:   Bilateral Distance:    Right Eye Near:   Left Eye Near:    Bilateral Near:     [facility] Couse / Diagnostics / Procedures:    EKG  Radiology DG Chest 2 View  Result Date: 03/27/2021 CLINICAL DATA:  Cough EXAM: CHEST - 2 VIEW COMPARISON:  01/30/2020 FINDINGS: The heart size and mediastinal contours are within normal limits. No focal airspace consolidation, pleural effusion, or pneumothorax. The visualized skeletal structures are unremarkable. IMPRESSION: No active cardiopulmonary disease. Electronically Signed   By: Duanne GuessNicholas  Plundo D.O.   On: 03/27/2021 09:12    Procedures Procedures (including critical care time)  UC Diagnoses / Final Clinical Impressions(s)   I have reviewed the triage vital signs and the nursing notes.  Pertinent labs & imaging results that were available during my care of the patient were reviewed by me and  considered in my medical decision making (see chart for details).   Final diagnoses:  Acute upper respiratory infection  Acute cough  Rhinosinusitis  Bacterial sinusitis   Physical exam findings mildly concerning for possible bacterial superinfection of initial viral infection.  Checks x-ray today is normal, patient reassured.  Recommend 10 days of Augmentin which patient states she has tolerated well in the past and aggressive use of nasal decongestants.  Prescriptions provided.  Return precautions advised.  ED Prescriptions     Medication Sig Dispense Auth. Provider   mometasone (NASONEX) 50 MCG/ACT nasal spray Place 2 sprays into the nose daily. 1 each Theadora RamaMorgan, Anju Sereno Scales, PA-C   ipratropium (ATROVENT) 0.06 % nasal spray Place 2 sprays into both nostrils 4 (four) times daily. As needed for nasal congestion, runny nose 15 mL Theadora RamaMorgan, Numan Zylstra Scales, PA-C   fluconazole (DIFLUCAN) 150 MG tablet Take 1 tablet on day 4 of antibiotics.  Take second tablet 3 days later. 2 tablet Theadora RamaMorgan, Zackarey Holleman Scales, PA-C   amoxicillin-clavulanate (AUGMENTIN) 875-125 MG tablet Take 1 tablet by mouth 2 (two) times daily for 10 days. 20 tablet Theadora RamaMorgan, Oluwadamilola Rosamond Scales, PA-C      PDMP not reviewed this encounter.  Pending results:  Labs Reviewed - No data to display  Medications Ordered in UC: Medications - No data to display  Disposition Upon Discharge:  Condition: stable for discharge home Home: take medications as prescribed; routine discharge instructions as discussed; follow up as advised.  Patient presented with an acute illness with associated systemic symptoms and significant discomfort requiring urgent management.  In my opinion, this is a condition that a prudent lay person (someone who possesses an average knowledge of health and medicine) may potentially expect to result in complications if not addressed urgently such as respiratory distress, impairment of bodily function or dysfunction of bodily  organs.   Routine symptom specific, illness specific and/or disease specific instructions were discussed with the patient and/or caregiver at length.   As such, the patient has been evaluated and assessed, work-up was performed and treatment was provided in alignment with urgent care protocols and evidence based medicine.  Patient/parent/caregiver has been advised that the patient may require follow up for further testing and treatment if the symptoms continue in spite of treatment, as clinically indicated and appropriate.  If the patient was tested for COVID-19, Influenza and/or RSV, then the patient/parent/guardian was advised to isolate at home pending the results of his/her diagnostic coronavirus test and potentially longer if theyre positive. I have also advised pt that if his/her COVID-19 test returns positive, it's recommended to self-isolate for at least 10 days after symptoms first appeared AND until fever-free for 24 hours without fever reducer AND other symptoms have improved or resolved. Discussed self-isolation recommendations as well as instructions for household member/close contacts as per the [facility] and [place] [facility]HS, and also gave patient the COVID packet with this information.  Patient/parent/caregiver has been advised to return to the [facility] or PCP in 3-5 days if no better; to PCP or the Emergency Department if new signs and symptoms develop, or if the current signs or symptoms continue to change or worsen for further workup, evaluation and treatment as clinically indicated and appropriate  The patient will follow up with their current PCP if and as advised. If the patient does not currently have a PCP we will assist them in obtaining one.   The patient may need specialty follow up if the symptoms continue, in spite of conservative treatment and management, for further workup, evaluation, consultation and treatment as clinically indicated and appropriate.  Patient/parent/caregiver verbalized  understanding and agreement of plan as discussed.  All questions were addressed during visit.  Please see discharge instructions below for further details of plan.  Discharge Instructions:   Discharge Instructions      Your chest x-ray today is unremarkable for any signs of pulmonary disease.  Because you are showing signs of upper respiratory infection, I do recommend that you begin a Augmentin, 1 capsule twice daily for the next 10 days.  I have renewed your prescription for ipratropium nasal spray.  Also provided you with a prescription for mometasone which appears to be covered by your insurance.  I provided you with 2 refills of each.  If you tolerate ibuprofen, I always recommend 400 mg 3 times daily for respiratory infections because ibuprofen is anti-inflammatory, columns respiratory inflammation and can help move things along with your recovery.  Finally, because we know that antibiotics cause vaginal yeast infections in women, I provided you with a prescription for 2 tablets of Diflucan.  Please take them as prescribed if needed.  Thank you for visiting urgent care today.  I appreciate the opportunity to participate in your care.    This office note has been dictated using Teaching laboratory technician.  Unfortunately, and despite my best efforts, this method of dictation can sometimes lead to occasional typographical or grammatical errors.  I apologize in advance if this occurs.     Theadora Rama Scales, PA-C 03/27/21 [id number]    Theadora Rama Scales, PA-C  03/27/21 0926    Theadora Rama Scales, PA-C 03/27/21 [phone]

## 2021-06-20 ENCOUNTER — Other Ambulatory Visit: Payer: Self-pay

## 2021-06-20 ENCOUNTER — Emergency Department (HOSPITAL_BASED_OUTPATIENT_CLINIC_OR_DEPARTMENT_OTHER): Payer: BC Managed Care – PPO

## 2021-06-20 ENCOUNTER — Emergency Department (HOSPITAL_BASED_OUTPATIENT_CLINIC_OR_DEPARTMENT_OTHER)
Admission: EM | Admit: 2021-06-20 | Discharge: 2021-06-20 | Disposition: A | Discharge status: Home / Self Care | Payer: BC Managed Care – PPO | Attending: Emergency Medicine | Admitting: Emergency Medicine

## 2021-06-20 ENCOUNTER — Encounter (HOSPITAL_BASED_OUTPATIENT_CLINIC_OR_DEPARTMENT_OTHER): Payer: Self-pay

## 2021-06-20 DIAGNOSIS — N8301 Follicular cyst of right ovary: Secondary | ICD-10-CM | POA: Diagnosis not present

## 2021-06-20 DIAGNOSIS — N83209 Unspecified ovarian cyst, unspecified side: Secondary | ICD-10-CM

## 2021-06-20 DIAGNOSIS — R1031 Right lower quadrant pain: Secondary | ICD-10-CM | POA: Diagnosis present

## 2021-06-20 DIAGNOSIS — Z91040 Latex allergy status: Secondary | ICD-10-CM | POA: Diagnosis not present

## 2021-06-20 LAB — COMPREHENSIVE METABOLIC PANEL
ALT: 19 U/L (ref 0–44)
AST: 21 U/L (ref 15–41)
Albumin: 4.2 g/dL (ref 3.5–5.0)
Alkaline Phosphatase: 56 U/L (ref 38–126)
Anion gap: 7 (ref 5–15)
BUN: 13 mg/dL (ref 6–20)
CO2: 29 mmol/L (ref 22–32)
Calcium: 9.5 mg/dL (ref 8.9–10.3)
Chloride: 103 mmol/L (ref 98–111)
Creatinine, Ser: 0.9 mg/dL (ref 0.44–1.00)
GFR, Estimated: >60 mL/min (ref >60)
Glucose, Bld: 81 mg/dL (ref 70–99)
Potassium: 4.3 mmol/L (ref 3.5–5.1)
Sodium: 139 mmol/L (ref 135–145)
Total Bilirubin: 0.3 mg/dL (ref 0.3–1.2)
Total Protein: 7.6 g/dL (ref 6.5–8.1)

## 2021-06-20 LAB — URINALYSIS, ROUTINE W REFLEX MICROSCOPIC
Bilirubin Urine: NEGATIVE
Glucose, UA: NEGATIVE mg/dL
Hgb urine dipstick: NEGATIVE
Ketones, ur: NEGATIVE mg/dL
Leukocytes,Ua: NEGATIVE
Nitrite: NEGATIVE
Protein, ur: NEGATIVE mg/dL
Specific Gravity, Urine: 1.01 (ref 1.005–1.030)
pH: 7 (ref 5.0–8.0)

## 2021-06-20 LAB — LIPASE, BLOOD: Lipase: 40 U/L (ref 11–51)

## 2021-06-20 LAB — CBC
HCT: 39.9 % (ref 36.0–46.0)
Hemoglobin: 12.5 g/dL (ref 12.0–15.0)
MCH: 29.3 pg (ref 26.0–34.0)
MCHC: 31.3 g/dL (ref 30.0–36.0)
MCV: 93.7 fL (ref 80.0–100.0)
Platelets: 261 10*3/uL (ref 150–400)
RBC: 4.26 MIL/uL (ref 3.87–5.11)
RDW: 13.1 % (ref 11.5–15.5)
WBC: 5 10*3/uL (ref 4.0–10.5)
nRBC: 0 % (ref 0.0–0.2)

## 2021-06-20 LAB — PREGNANCY, URINE: Preg Test, Ur: NEGATIVE

## 2021-06-20 MED ORDER — NAPROXEN 500 MG PO TABS: 500.0000 mg | ORAL_TABLET | Freq: Two times a day (BID) | ORAL | 0 refills | Status: AC

## 2021-06-20 MED ORDER — KETOROLAC TROMETHAMINE 60 MG/2ML IM SOLN
60.0000 mg | Freq: Once | INTRAMUSCULAR | Status: AC
  Administered 2021-06-20: 60 mg via INTRAMUSCULAR
  Filled 2021-06-20: qty 2

## 2021-06-20 NOTE — Discharge Instructions (Addendum)
You were seen in the emergency department for abdominal pain.  As we discussed on your ultrasound you have was called a hemorrhagic cyst.  The best treatment for this is over-the-counter medications for pain and following up with your OB/GYN.  I have written you a new prescription for naproxen.

## 2021-06-20 NOTE — ED Triage Notes (Signed)
Pt presents with RLQ pain starting last night. Pt with a significant hx of endometriosis, pt reports 1 ovary, she states, Last time this happened she had to have emergency surgery for oophorectomy and appendectomy 2008". Pt had a partial hysterectomy in 2003.  Pt reports her OBGYN sent her to [facility] for possible emergency surgery

## 2021-06-20 NOTE — ED Provider Notes (Signed)
[place] EMERGENCY DEPT Provider Note   CSN: [id number] Arrival date & time: 06/20/21  1117     History  Chief Complaint  Patient presents with   Abdominal Pain    Mindy Logan is a 46 y.o. female who presents the emergency department complaining of right lower quadrant abdominal pain beginning last night.  Patient has a history of endometriosis, s/p partial hysterectomy, oophorectomy, and appendectomy.  She was sent by her OB/GYN with concern for requiring surgery.  She has been trying naproxen with minimal relief.  No fever, nausea, vomiting, diarrhea, urinary symptoms.   Abdominal Pain Associated symptoms: no diarrhea, no dysuria, no fever, no hematuria, no nausea, no vaginal bleeding, no vaginal discharge and no vomiting       Home Medications Prior to Admission medications   Medication Sig Start Date End Date Taking? Authorizing Provider  naproxen (NAPROSYN) 500 MG tablet Take 1 tablet (500 mg total) by mouth 2 (two) times daily. 06/20/21  Yes Breckan Cafiero T, PA-C  fluconazole (DIFLUCAN) 150 MG tablet Take 1 tablet on day 4 of antibiotics.  Take second tablet 3 days later. 03/27/21   Lynden Oxford Scales, PA-C  ipratropium (ATROVENT) 0.06 % nasal spray Place 2 sprays into both nostrils 4 (four) times daily. As needed for nasal congestion, runny nose 03/27/21   Lynden Oxford Scales, PA-C  mometasone (NASONEX) 50 MCG/ACT nasal spray Place 2 sprays into the nose daily. 03/27/21 06/25/21  Lynden Oxford Scales, PA-C  ORILISSA 150 MG TABS Take 1 tablet by mouth daily. 03/20/21   [provider]      Allergies    Banana, Clindamycin, Kiwi extract, Other, Povidone-iodine, Tetracycline, Tetracyclines & related, Iodine, Latex, Shellfish allergy, Wheat extract, Cephalexin, Clindamycin/lincomycin, Soap, and Tape    Review of Systems   Review of Systems  Constitutional:  Negative for fever.  Gastrointestinal:  Positive for abdominal pain. Negative for diarrhea,  nausea and vomiting.  Genitourinary:  Negative for dysuria, frequency, hematuria, urgency, vaginal bleeding and vaginal discharge.  All other systems reviewed and are negative.  Physical Exam Updated Vital Signs BP (!) 187/94 (BP Location: Left Arm)   Pulse 68   Temp 98.7 F (37.1 C) (Oral)   Resp 16   SpO2 98%  Physical Exam Vitals and nursing note reviewed.  Constitutional:      Appearance: Normal appearance.  HENT:     Head: Normocephalic and atraumatic.  Eyes:     Conjunctiva/sclera: Conjunctivae normal.  Cardiovascular:     Rate and Rhythm: Normal rate and regular rhythm.  Pulmonary:     Effort: Pulmonary effort is normal. No respiratory distress.     Breath sounds: Normal breath sounds.  Abdominal:     General: There is no distension.     Palpations: Abdomen is soft.     Tenderness: There is abdominal tenderness in the right lower quadrant. There is guarding. There is no right CVA tenderness, left CVA tenderness or rebound.  Skin:    General: Skin is warm and dry.  Neurological:     General: No focal deficit present.     Mental Status: She is alert.    ED Results / Procedures / Treatments   Labs (all labs ordered are listed, but only abnormal results are displayed) Labs Reviewed  URINALYSIS, ROUTINE W REFLEX MICROSCOPIC - Abnormal; Notable for the following components:      Result Value   Color, Urine COLORLESS (*)    All other components within normal limits  LIPASE,  BLOOD  COMPREHENSIVE METABOLIC PANEL  CBC  PREGNANCY, URINE    EKG None  Radiology US PELVIC COMPLETE W TRANSVAGINAL AND TORSION R/O  Result Date: 06/20/2021 CLINICAL DATA:  Pelvic pain EXAM: TRANSABDOMINAL AND TRANSVAGINAL ULTRASOUND OF PELVIS DOPPLER ULTRASOUND OF OVARIES TECHNIQUE: Both transabdominal and transvaginal ultrasound examinations of the pelvis were performed. Transabdominal technique was performed for global imaging of the pelvis including uterus, ovaries, adnexal regions, and  pelvic cul-de-sac. It was necessary to proceed with endovaginal exam following the transabdominal exam to visualize the ovaries. Color and duplex Doppler ultrasound was utilized to evaluate blood flow to the ovaries. COMPARISON:  None Available. FINDINGS: Uterus Not seen consistent with hysterectomy. Right ovary Measurements: 3.3 x 2.6 x 2.7 cm = volume: 11.8 mL. There is 2.6 x 2.5 x 2.4 cm complex cyst with thin internal septations. There other smaller follicles in the margin of the right ovary. Left ovary Not sonographically visualized consistent with history of left oophorectomy. Pulsed Doppler evaluation of right ovary demonstrates normal low-resistance arterial and venous waveforms. Other findings There is 2.4 x 1.4 x 2.4 cm structure inseparable from vaginal cuff with numerous small hyperechoic foci. There is no demonstrable internal vascularity in this lesion. Small amount of free fluid is seen in pelvis. IMPRESSION: Status post hysterectomy and left oophorectomy. There is a complex 2.6 cm cyst with thin internal septations in the right ovary. This may suggest hemorrhagic follicle or hemorrhagic cyst. Small amount of free fluid is seen in the pelvis. There is 2.4 cm structure with numerous punctate hyperechoic foci inseparable from the vaginal cuff. There is no demonstrable vascularity within this structure. Nature and significance of this finding is not clear. Short-term follow-up pelvic sonogram in 1-2 months along with MRI if warranted may be considered. Electronically Signed   By: Elmer Picker M.D.   On: 06/20/2021 16:52    Procedures Procedures    Medications Ordered in ED Medications  ketorolac (TORADOL) injection 60 mg (60 mg Intramuscular Given 06/20/21 1433)    ED Course/ Medical Decision Making/ A&P                           Medical Decision Making Amount and/or Complexity of Data Reviewed Labs: ordered. Radiology: ordered.  Risk Prescription drug management.  This  patient is a 46 y.o. female who presents to the ED for concern of abdominal pain, this involves an extensive number of treatment options, and is a complaint that carries with it a high risk of complications and morbidity. The emergent differential diagnosis prior to evaluation includes, but is not limited to,  Pelvic inflammatory disease, appendicitis, urinary calculi, ruptured ovarian cyst or tumor, ovarian torsion, tubo-ovarian abscess, degeneration of fibroid, endometriosis, diverticulitis, cystitis. This is not an exhaustive differential.   Past Medical History / Co-morbidities / Social History: Migraine, IBS, anxiety, endometriosis S/p abdominal hysterectomy, appendectomy, oophorectomy  Physical Exam: Physical exam performed. The pertinent findings include: Right lower quadrant tenderness palpation with mild guarding, no rebound.  Hypertensive, otherwise normal vital signs.  Lab Tests: I ordered, and personally interpreted labs.  The pertinent results include: No leukocytosis, normal hemoglobin.  Electrolytes within normal limits.  Normal kidney function.  Urinalysis negative for hematuria or infection.   Imaging Studies: I ordered imaging studies including pelvic ultrasound. I independently visualized and interpreted imaging which showed complex right ovarian cyst, consistent with hemorrhagic cyst and 2.4 cm nonspecific structure inseparable from vaginal cuff. I agree with the radiologist interpretation.  Medications: I ordered medication including Toradol for abdominal pain. Reevaluation of the patient after these medicines showed that the patient improved. I have reviewed the patients home medicines and have made adjustments as needed.  Disposition: After consideration of the diagnostic results and the patients response to treatment, I feel that patient's not requiring admission.  Recommend patient take NSAIDs, and follow-up with the OB/GYN at her scheduled appointment on Thursday.   Recommended she have repeat ultrasound, especially to follow the other lesion.  We discussed reasons to return to the emergency department, and patient is agreeable to the plan.   Final Clinical Impression(s) / ED Diagnoses Final diagnoses:  Hemorrhagic ovarian cyst    Rx / DC Orders ED Discharge Orders          Ordered    naproxen (NAPROSYN) 500 MG tablet  2 times daily        06/20/21 1720           Portions of this report may have been transcribed using voice recognition software. Every effort was made to ensure accuracy; however, inadvertent computerized transcription errors may be present.    Estill Cotta 06/20/21 1741    Davonna Belling, MD 06/20/21 2328

## 2021-09-20 IMAGING — DX DG CHEST 2V
2 series · 2 of 2 positions shown · non-contrast
Comparison: 02/09/2017

CLINICAL DATA: Cough and left-sided chest pain

EXAM:
CHEST - 2 VIEW

[chest pa]
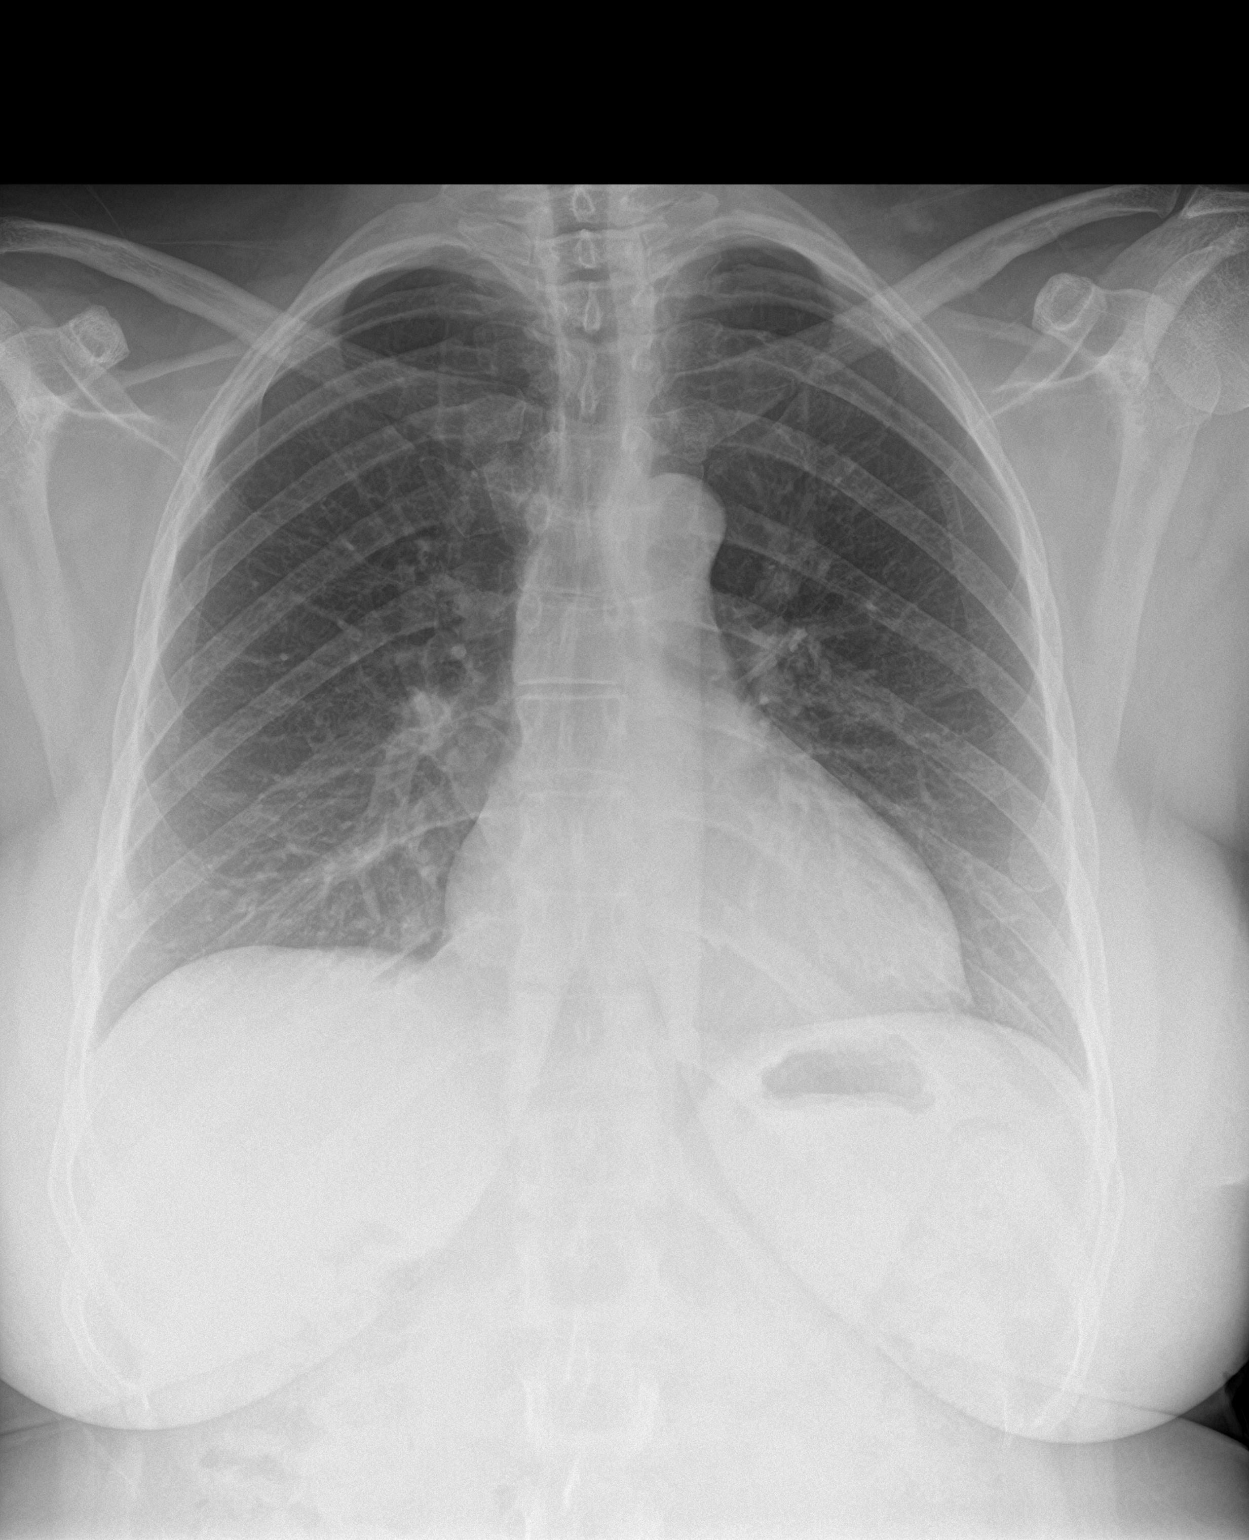

[chest lat]
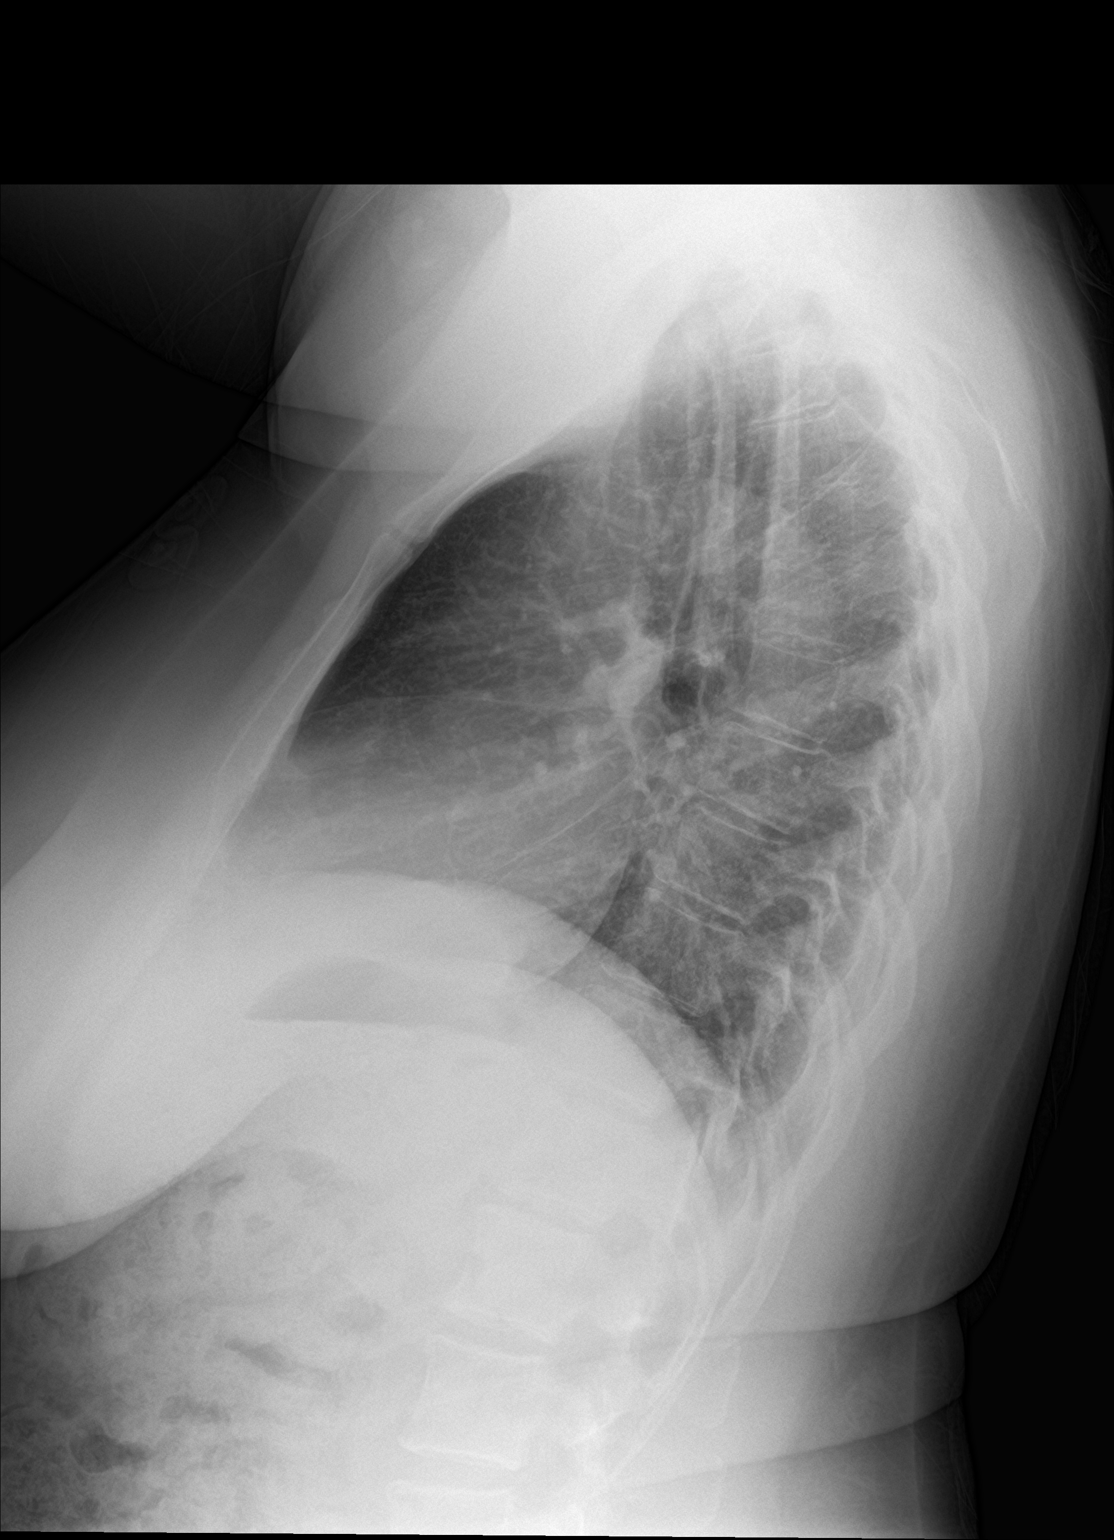

[2 of 2 positions shown; findings below may reference images not displayed]

FINDINGS: The heart size and mediastinal contours are within normal limits.
Both lungs are clear. The visualized skeletal structures are
unremarkable.
IMPRESSION: No active cardiopulmonary disease.

## 2021-11-02 ENCOUNTER — Ambulatory Visit
Admission: RE | Admit: 2021-11-02 | Discharge: 2021-11-02 | Disposition: A | Discharge status: Home / Self Care | Payer: BC Managed Care – PPO | Source: Ambulatory Visit | Attending: Emergency Medicine | Admitting: Emergency Medicine

## 2021-11-02 VITALS — BP 150/94 | HR 85 | Temp 98.6°F | Resp 18 | Ht 65.5 in | Wt 210.0 lb

## 2021-11-02 DIAGNOSIS — R110 Nausea: Secondary | ICD-10-CM

## 2021-11-02 DIAGNOSIS — J0100 Acute maxillary sinusitis, unspecified: Secondary | ICD-10-CM

## 2021-11-02 MED ORDER — AMOXICILLIN 875 MG PO TABS: 875.0000 mg | ORAL_TABLET | Freq: Two times a day (BID) | ORAL | 0 refills | Status: AC

## 2021-11-02 MED ORDER — ONDANSETRON 4 MG PO TBDP: 4.0000 mg | ORAL_TABLET | Freq: Three times a day (TID) | ORAL | 0 refills | Status: AC | PRN

## 2021-11-02 NOTE — Discharge Instructions (Addendum)
Take the amoxicillin and Zofran as directed.  Follow up with your primary care provider if your symptoms are not improving.

## 2021-11-02 NOTE — ED Provider Notes (Signed)
[name]    CSN: [id number] Arrival date & time: 11/02/21  1154      History   Chief Complaint Chief Complaint  Patient presents with   Cough    Sick for over a week. Coughing, sneezing, runny nose, lethargic - Entered by patient    HPI Mindy Logan is a 46 y.o. female.  Patient presents with 1 week history of congestion, sinus drainage, runny nose, sneezing, sore throat, cough, nausea.  No fever, rash, chest pain, shortness of breath, vomiting, diarrhea, or other symptoms.  Two negative COVID tests at home.  Treatment attempted at home with Alka-Seltzer cold and several homeopathic remedies.  She reports history of bronchitis and pneumonia.  Patient is a high [employer].  The history is provided by the patient and medical records.    Past Medical History:  Diagnosis Date   Anxiety    IBS (irritable bowel syndrome)    Migraine     There are no problems to display for this patient.   Past Surgical History:  Procedure Laterality Date   ABDOMINAL HYSTERECTOMY     ANKLE SURGERY Left    APPENDECTOMY     CHOLECYSTECTOMY     OOPHORECTOMY     TONSILLECTOMY      OB History   No obstetric history on file.      Home Medications    Prior to Admission medications   Medication Sig Start Date End Date Taking? Authorizing Provider  amoxicillin (AMOXIL) 875 MG tablet Take 1 tablet (875 mg total) by mouth 2 (two) times daily for 10 days. 11/02/21 11/12/21 Yes Sharion Balloon, NP  ondansetron (ZOFRAN-ODT) 4 MG disintegrating tablet Take 1 tablet (4 mg total) by mouth every 8 (eight) hours as needed for nausea or vomiting. 11/02/21  Yes Sharion Balloon, NP  fluconazole (DIFLUCAN) 150 MG tablet Take 1 tablet on day 4 of antibiotics.  Take second tablet 3 days later. 03/27/21   Lynden Oxford Scales, PA-C  ipratropium (ATROVENT) 0.06 % nasal spray Place 2 sprays into both nostrils 4 (four) times daily. As needed for nasal congestion, runny nose 03/27/21   Lynden Oxford  Scales, PA-C  mometasone (NASONEX) 50 MCG/ACT nasal spray Place 2 sprays into the nose daily. 03/27/21 06/25/21  Lynden Oxford Scales, PA-C  naproxen (NAPROSYN) 500 MG tablet Take 1 tablet (500 mg total) by mouth 2 (two) times daily. 06/20/21   Roemhildt, Lorin T, PA-C  ORILISSA 150 MG TABS Take 1 tablet by mouth daily. 03/20/21   [provider]    Family History Family History  Problem Relation Age of Onset   CAD Mother    CAD Father    Hyperlipidemia Father     Social History Social History   Tobacco Use   Smoking status: Never   Smokeless tobacco: Never  Vaping Use   Vaping Use: Never used  Substance Use Topics   Alcohol use: Yes   Drug use: No     Allergies   Banana, Clindamycin, Kiwi extract, Other, Povidone-iodine, Tetracycline, Tetracyclines & related, Iodine, Latex, Shellfish allergy, Wheat extract, Cephalexin, Clindamycin/lincomycin, Soap, and Tape   Review of Systems Review of Systems  Constitutional:  Negative for chills and fever.  HENT:  Positive for congestion, postnasal drip, rhinorrhea and sore throat. Negative for ear pain.   Respiratory:  Positive for cough. Negative for shortness of breath.   Cardiovascular:  Negative for chest pain and palpitations.  Gastrointestinal:  Positive for nausea. Negative for diarrhea and  vomiting.  Skin:  Negative for color change and rash.  All other systems reviewed and are negative.    Physical Exam Triage Vital Signs ED Triage Vitals  Enc Vitals Group     BP 11/02/21 1214 (!) 150/94     Pulse Rate 11/02/21 1214 85     Resp 11/02/21 1214 18     Temp 11/02/21 1214 98.6 F (37 C)     Temp src --      SpO2 11/02/21 1214 98 %     Weight 11/02/21 1220 210 lb (95.3 kg)     Height 11/02/21 1220 5' 5.5" (1.664 m)     Head Circumference --      Peak Flow --      Pain Score 11/02/21 1218 4     Pain Loc --      Pain Edu? --      Excl. in [place]? --    No data found.  Updated Vital Signs BP (!) 150/94   Pulse  85   Temp 98.6 F (37 C)   Resp 18   Ht 5' 5.5" (1.664 m)   Wt 210 lb (95.3 kg)   SpO2 98%   BMI 34.41 kg/m   Visual Acuity Right Eye Distance:   Left Eye Distance:   Bilateral Distance:    Right Eye Near:   Left Eye Near:    Bilateral Near:     Physical Exam Vitals and nursing note reviewed.  Constitutional:      General: She is not in acute distress.    Appearance: Normal appearance. She is well-developed. She is not ill-appearing.  HENT:     Right Ear: Tympanic membrane normal.     Left Ear: Tympanic membrane normal.     Nose: Congestion present.     Mouth/Throat:     Mouth: Mucous membranes are moist.     Pharynx: Oropharynx is clear.  Cardiovascular:     Rate and Rhythm: Normal rate and regular rhythm.     Heart sounds: Normal heart sounds.  Pulmonary:     Effort: Pulmonary effort is normal. No respiratory distress.     Breath sounds: Normal breath sounds.  Abdominal:     General: Bowel sounds are normal.     Palpations: Abdomen is soft.     Tenderness: There is no abdominal tenderness. There is no guarding or rebound.  Musculoskeletal:     Cervical back: Neck supple.  Skin:    General: Skin is warm and dry.  Neurological:     Mental Status: She is alert.  Psychiatric:        Mood and Affect: Mood normal.        Behavior: Behavior normal.      UC Treatments / Results  Labs (all labs ordered are listed, but only abnormal results are displayed) Labs Reviewed - No data to display  EKG   Radiology No results found.  Procedures Procedures (including critical care time)  Medications Ordered in UC Medications - No data to display  Initial Impression / Assessment and Plan / UC Course  I have reviewed the triage vital signs and the nursing notes.  Pertinent labs & imaging results that were available during my care of the patient were reviewed by me and considered in my medical decision making (see chart for details).    Acute sinusitis, Nausea  without vomiting.  Patient has been symptomatic for 1 week.  Treating today with amoxicillin.  Treating nausea with Zofran.  Education provided on sinus infection and nausea.  Instructed patient to follow up with her PCP if her symptoms are not improving.  She agrees to plan of care.    Final Clinical Impressions(s) / UC Diagnoses   Final diagnoses:  Acute non-recurrent maxillary sinusitis  Nausea without vomiting     Discharge Instructions      Take the amoxicillin and Zofran as directed.  Follow up with your primary care provider if your symptoms are not improving.        ED Prescriptions     Medication Sig Dispense Auth. Provider   ondansetron (ZOFRAN-ODT) 4 MG disintegrating tablet Take 1 tablet (4 mg total) by mouth every 8 (eight) hours as needed for nausea or vomiting. 20 tablet Sharion Balloon, NP   amoxicillin (AMOXIL) 875 MG tablet Take 1 tablet (875 mg total) by mouth 2 (two) times daily for 10 days. 20 tablet Sharion Balloon, NP      PDMP not reviewed this encounter.   Sharion Balloon, NP 11/02/21 1302

## 2021-11-02 NOTE — ED Triage Notes (Addendum)
Patient to Urgent Care with complaints of cough, nasal drainage, congestion, sore throat, sneezing and nausea x1 week. Reports hx of pneumonia and bronchiolitis.   X2 negative home covid tests.  Has been taking alka seltzer cold, mometasone furoate nasal spray, ginger/ tumeric. Miminal improvement in symptoms.

## 2023-02-09 IMAGING — US US PELVIS COMPLETE TRANSABD/TRANSVAG W DUPLEX AND/OR DOPPLER
1 series · 13 of 25 positions shown · non-contrast
Comparison: None Available.

CLINICAL DATA: Pelvic pain

EXAM:
TRANSABDOMINAL AND TRANSVAGINAL ULTRASOUND OF PELVIS
DOPPLER ULTRASOUND OF OVARIES
TECHNIQUE: Both transabdominal and transvaginal ultrasound examinations of the
pelvis were performed. Transabdominal technique was performed for
global imaging of the pelvis including uterus, ovaries, adnexal
regions, and pelvic cul-de-sac.
It was necessary to proceed with endovaginal exam following the
transabdominal exam to visualize the ovaries. Color and duplex
Doppler ultrasound was utilized to evaluate blood flow to the
ovaries.

[Series 1: us pelvis (transabdominal only) · 13 of 44 slices shown]
[im 1/44]
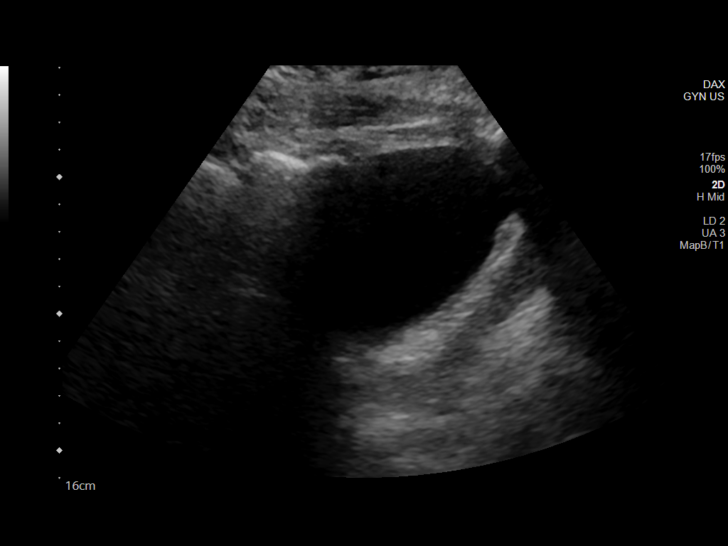
[im 4/44]
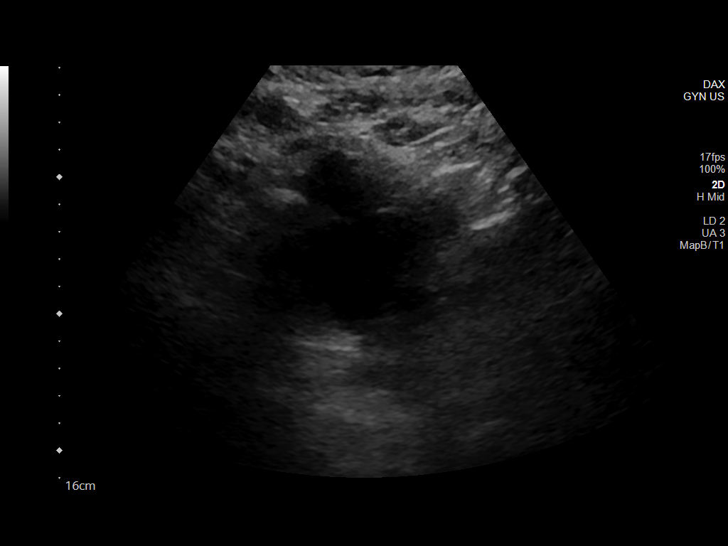
[im 8/44]
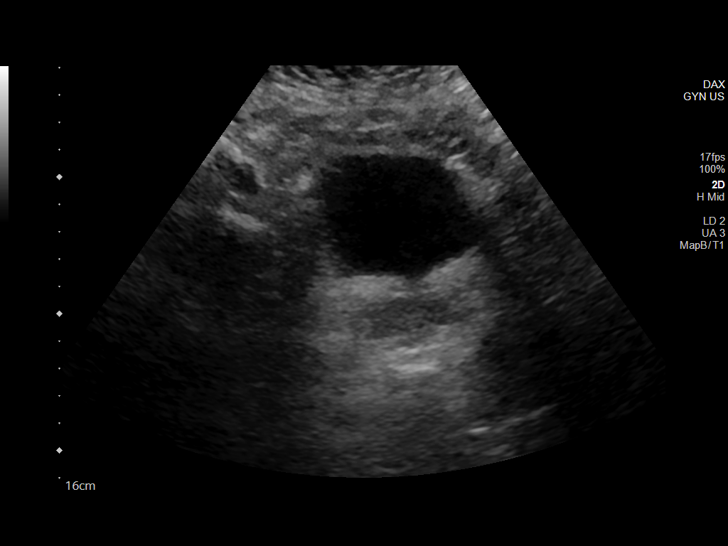
[im 11/44]
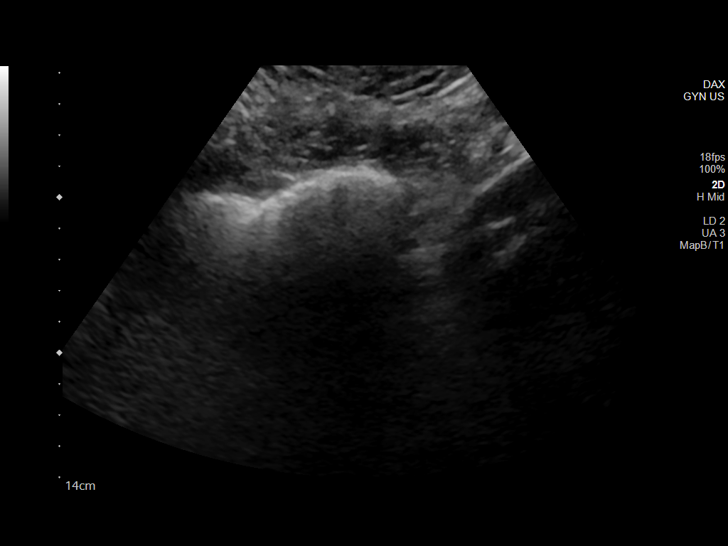
[im 15/44]
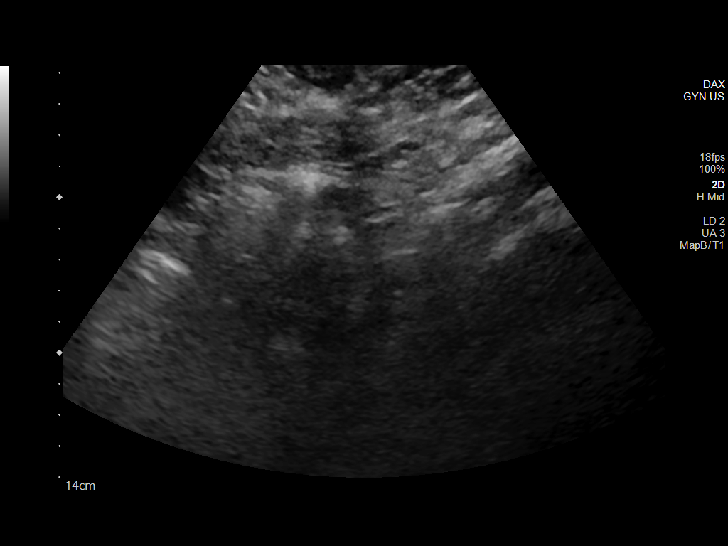
[im 18/44]
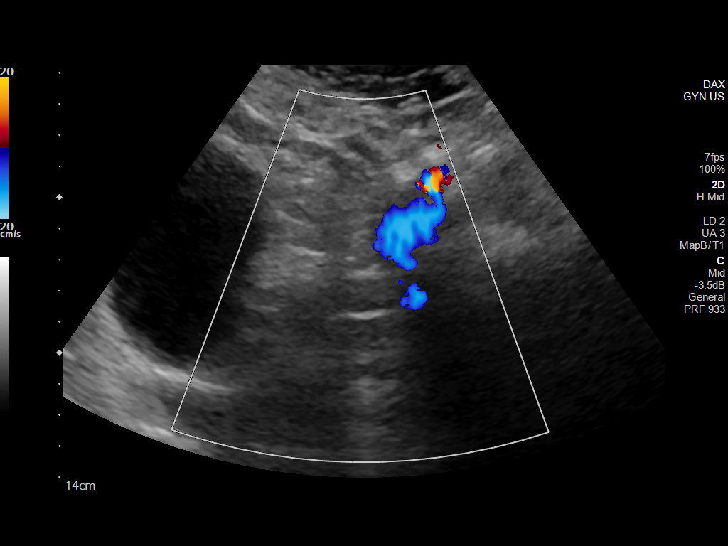
[im 22/44]
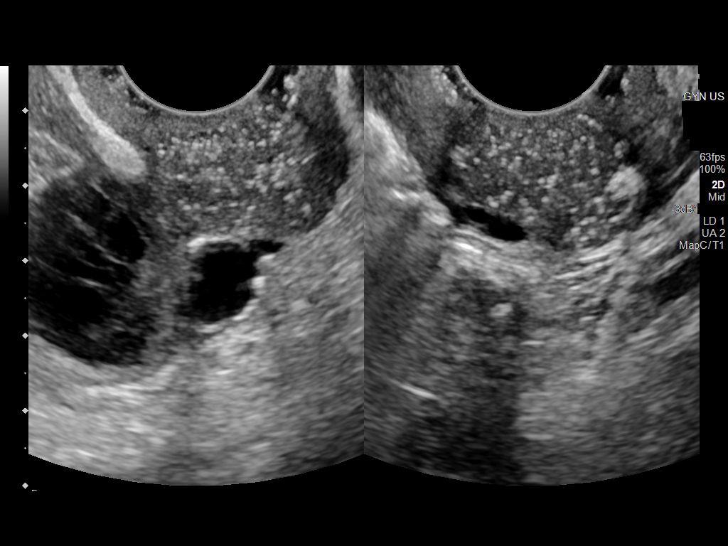
[im 26/44]
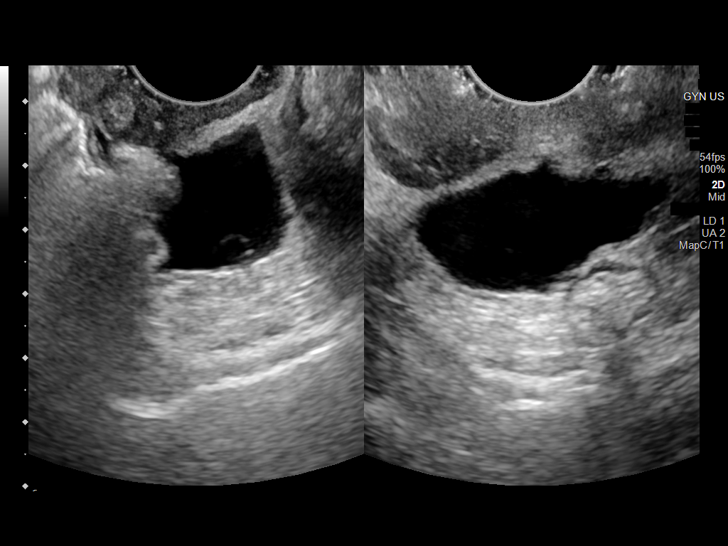
[im 29/44]
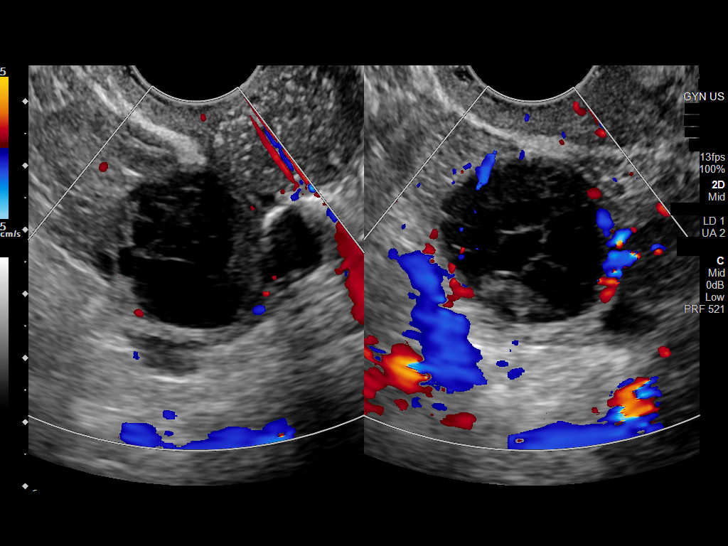
[im 33/44]
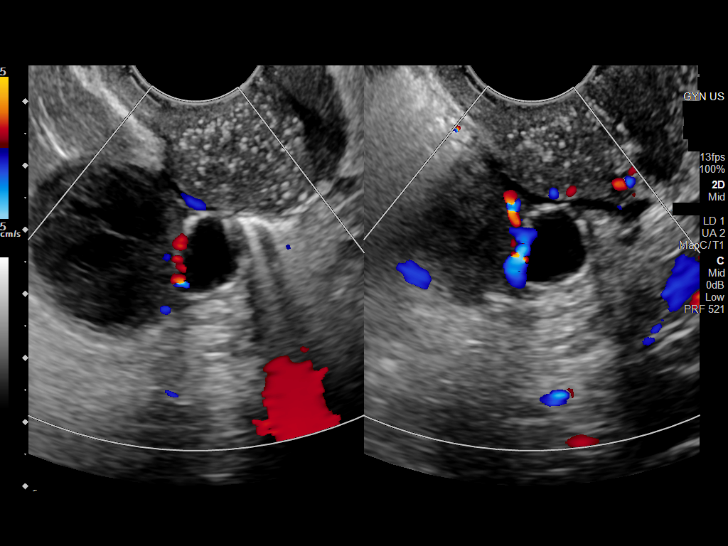
[im 36/44]
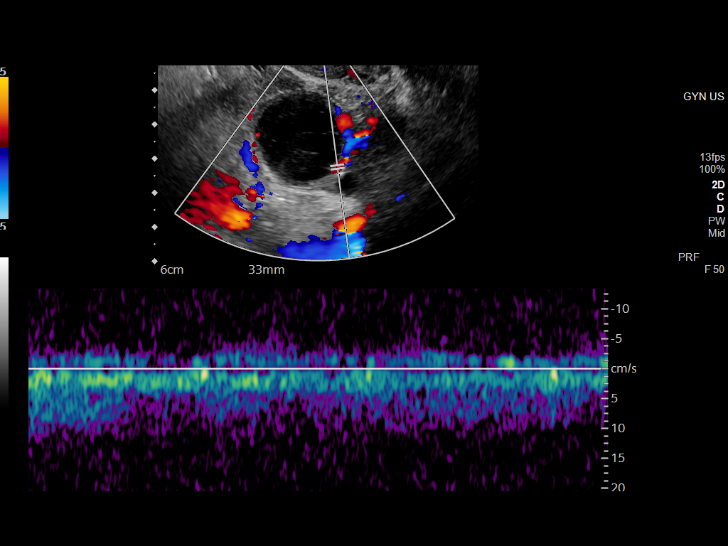
[im 40/44]
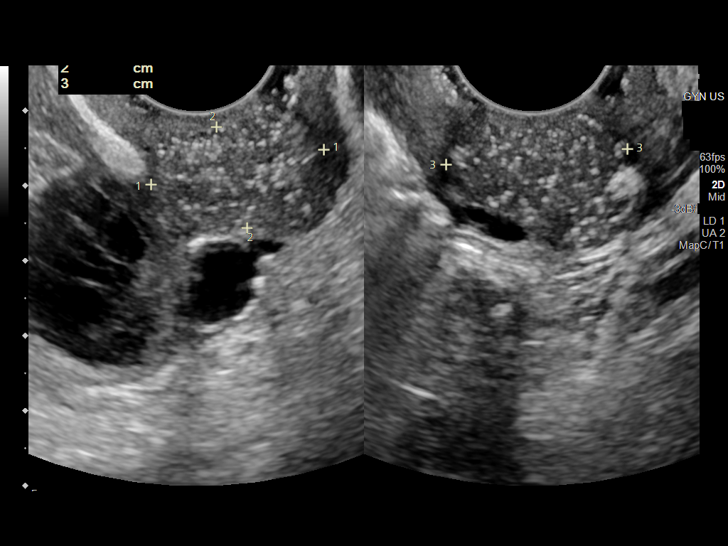
[im 44/44]
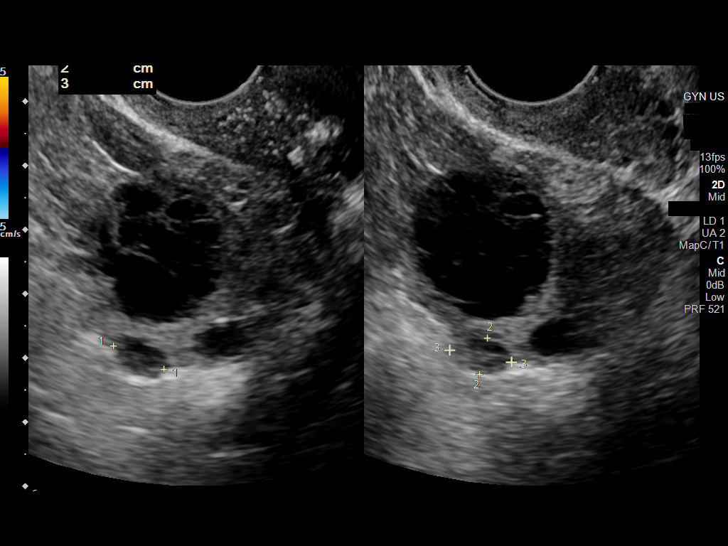

[13 of 25 positions shown; findings below may reference images not displayed]

FINDINGS: Uterus

Not seen consistent with hysterectomy.

Right ovary

Measurements: 3.3 x 2.6 x 2.7 cm = volume: 11.8 mL. There is 2.6 x
2.5 x 2.4 cm complex cyst with thin internal septations. There other
smaller follicles in the margin of the right ovary.

Left ovary

Not sonographically visualized consistent with history of left
oophorectomy.

Pulsed Doppler evaluation of right ovary demonstrates normal
low-resistance arterial and venous waveforms.

Other findings

There is 2.4 x 1.4 x 2.4 cm structure inseparable from vaginal cuff
with numerous small hyperechoic foci. There is no demonstrable
internal vascularity in this lesion. Small amount of free fluid is
seen in pelvis.
IMPRESSION: Status post hysterectomy and left oophorectomy.

There is a complex 2.6 cm cyst with thin internal septations in the
right ovary. This may suggest hemorrhagic follicle or hemorrhagic
cyst. Small amount of free fluid is seen in the pelvis.

There is 2.4 cm structure with numerous punctate hyperechoic foci
inseparable from the vaginal cuff. There is no demonstrable
vascularity within this structure. Nature and significance of this
finding is not clear. Short-term follow-up pelvic sonogram in 1-2
months along with MRI if warranted may be considered.
